# Patient Record
Sex: Male | Born: 1990 | Race: White | Hispanic: No | Smoking: Former smoker
Health system: Southern US, Community
[De-identification: ages and names within clinical notes are randomized; demographics above are authoritative.]

## PROBLEM LIST (undated history)

## (undated) DIAGNOSIS — Z8701 Personal history of pneumonia (recurrent): Secondary | ICD-10-CM

## (undated) DIAGNOSIS — G629 Polyneuropathy, unspecified: Secondary | ICD-10-CM

## (undated) DIAGNOSIS — F840 Autistic disorder: Secondary | ICD-10-CM

## (undated) DIAGNOSIS — I10 Essential (primary) hypertension: Secondary | ICD-10-CM

## (undated) DIAGNOSIS — F319 Bipolar disorder, unspecified: Secondary | ICD-10-CM

## (undated) DIAGNOSIS — F419 Anxiety disorder, unspecified: Secondary | ICD-10-CM

## (undated) DIAGNOSIS — Z779 Other contact with and (suspected) exposures hazardous to health: Secondary | ICD-10-CM

## (undated) DIAGNOSIS — G4730 Sleep apnea, unspecified: Secondary | ICD-10-CM

## (undated) DIAGNOSIS — U071 COVID-19: Secondary | ICD-10-CM

## (undated) DIAGNOSIS — Z8659 Personal history of other mental and behavioral disorders: Secondary | ICD-10-CM

## (undated) DIAGNOSIS — J45909 Unspecified asthma, uncomplicated: Secondary | ICD-10-CM

## (undated) HISTORY — DX: Autistic disorder: F84.0

## (undated) HISTORY — DX: Polyneuropathy, unspecified: G62.9

## (undated) HISTORY — DX: COVID-19: U07.1

## (undated) HISTORY — DX: Personal history of pneumonia (recurrent): Z87.01

## (undated) HISTORY — DX: Personal history of other mental and behavioral disorders: Z86.59

## (undated) HISTORY — PX: OTHER SURGICAL HISTORY: SHX169

## (undated) HISTORY — DX: Sleep apnea, unspecified: G47.30

## (undated) HISTORY — DX: Bipolar disorder, unspecified: F31.9

## (undated) HISTORY — DX: Anxiety disorder, unspecified: F41.9

## (undated) HISTORY — PX: TONSILLECTOMY: SUR1361

## (undated) HISTORY — PX: APPENDECTOMY: SHX54

## (undated) HISTORY — DX: Other contact with and (suspected) exposures hazardous to health: Z77.9

## (undated) HISTORY — DX: Unspecified asthma, uncomplicated: J45.909

---

## 2013-03-28 DIAGNOSIS — K219 Gastro-esophageal reflux disease without esophagitis: Secondary | ICD-10-CM | POA: Insufficient documentation

## 2019-11-30 DIAGNOSIS — E66811 Obesity, class 1: Secondary | ICD-10-CM | POA: Insufficient documentation

## 2019-11-30 DIAGNOSIS — E669 Obesity, unspecified: Secondary | ICD-10-CM | POA: Insufficient documentation

## 2021-02-23 DIAGNOSIS — U071 COVID-19: Secondary | ICD-10-CM | POA: Insufficient documentation

## 2021-03-20 ENCOUNTER — Other Ambulatory Visit: Payer: Self-pay

## 2021-03-20 ENCOUNTER — Ambulatory Visit: Payer: BC Managed Care – PPO | Admitting: Cardiology

## 2021-03-20 ENCOUNTER — Ambulatory Visit (INDEPENDENT_AMBULATORY_CARE_PROVIDER_SITE_OTHER): Payer: BC Managed Care – PPO

## 2021-03-20 ENCOUNTER — Encounter: Payer: Self-pay | Admitting: Cardiology

## 2021-03-20 VITALS — BP 130/80 | HR 105 | Ht 73.0 in | Wt 234.0 lb

## 2021-03-20 DIAGNOSIS — R001 Bradycardia, unspecified: Secondary | ICD-10-CM | POA: Diagnosis not present

## 2021-03-20 DIAGNOSIS — R0602 Shortness of breath: Secondary | ICD-10-CM | POA: Diagnosis not present

## 2021-03-20 NOTE — Progress Notes (Signed)
Cardiology Office Note:    Date:  03/20/2021   ID:  Seth Calderon, DOB June 07, 1991, MRN [id number]  PCP:  Orpah Greek, DO   [facility] Providers Cardiologist:  Kate Sable, MD     Referring MD: No ref. provider found   Chief Complaint  Patient presents with   New Patient (Initial Visit)    Referred by Urgent care for chest pain fatigue numbness tingling in extremeties (left arm/hand) dizziness decreased sats and hr. Patient states that when he gets up in the middle of the night his HR has been in the 30s. Meds reviewed verbally with patient.       History of Present Illness:    Seth Calderon is a [name] who presents due to low heart rate and shortness of breath.  Patient diagnosed with COVID-19 infection 3 weeks ago.  He had symptoms of muscle aches, shortness of breath, presented to the urgent care where COVID testing was positive.  He was started on medications which he took as prescribed for a week.  Since then, he has noticed slow heart rates especially when he is sleeping as low as 30s to 40s.  His conditioning has also decreased with him being more short of breath with his typical exercises.  He is not also able to obtain elevated heart rates as prior.  All symptoms began after COVID infection.  Minimal exertion causes him to be out of breath.  Has nonspecific chest discomfort and oral numbness.  Denies any history of heart disease.  History reviewed. No pertinent past medical history.  History reviewed. No pertinent surgical history.  Current Medications: Current Meds  Medication Sig   amphetamine-dextroamphetamine (ADDERALL XR) 20 MG 24 hr capsule Take 20 mg by mouth daily.   amphetamine-dextroamphetamine (ADDERALL) 20 MG tablet Take 20 mg by mouth daily.   buPROPion (WELLBUTRIN XL) 300 MG 24 hr tablet TAKE 1 TABLET BY MOUTH EVERY DAY IN THE MORNING   lamoTRIgine (LAMICTAL) 25 MG tablet Take 25 mg by mouth daily.      Allergies:   Cefaclor   Social History   Socioeconomic History   Marital status: Married    Spouse name: Not on file   Number of children: Not on file   Years of education: Not on file   Highest education level: Not on file  Occupational History   Not on file  Tobacco Use   Smoking status: Never    Passive exposure: Never   Smokeless tobacco: Never  Vaping Use   Vaping Use: Never used  Substance and Sexual Activity   Alcohol use: Never   Drug use: Never   Sexual activity: Not on file  Other Topics Concern   Not on file  Social History Narrative   Not on file   Social Determinants of Health   Financial Resource Strain: Not on file  Food Insecurity: Not on file  Transportation Needs: Not on file  Physical Activity: Not on file  Stress: Not on file  Social Connections: Not on file     Family History: The patient's family history is not on file.  ROS:   Please see the history of present illness.     All other systems reviewed and are negative.  EKGs/Labs/Other Studies Reviewed:    The following studies were reviewed today:   EKG:  EKG is  ordered today.  The ekg ordered today demonstrates sinus tachycardia, otherwise normal ECG, heart rate 105  Recent  Labs: No results found for requested labs within last 8760 hours.  Recent Lipid Panel No results found for: CHOL, TRIG, HDL, CHOLHDL, VLDL, LDLCALC, LDLDIRECT   Risk Assessment/Calculations:          Physical Exam:    VS:  BP 130/80 (BP Location: Left Arm, Patient Position: Sitting, Cuff Size: Normal)   Pulse (!) 105   Ht 6\' 1"  (1.854 m)   Wt 234 lb (106.1 kg)   SpO2 97%   BMI 30.87 kg/m     Wt Readings from Last 3 Encounters:  03/20/21 234 lb (106.1 kg)     GEN:  Well nourished, well developed in no acute distress HEENT: Normal NECK: No JVD; No carotid bruits LYMPHATICS: No lymphadenopathy CARDIAC: RRR, no murmurs, rubs, gallops RESPIRATORY:  Clear to auscultation without rales, wheezing  or rhonchi  ABDOMEN: Soft, non-tender, non-distended MUSCULOSKELETAL:  No edema; No deformity  SKIN: Warm and dry NEUROLOGIC:  Alert and oriented x 3 PSYCHIATRIC:  Normal affect   ASSESSMENT:    1. Bradycardia   2. Shortness of breath    PLAN:    In order of problems listed above:  Patient with abnormal heart rate since contracting COVID-19 infection.  Heart rates as low as 30s.  Place cardiac monitor to evaluate any conduction defects or heart blocks.  With low heart rates during sleep, possible OSA in differential.  Plan to refer patient to pulmonary medicine for OSA eval. Exertional shortness of breath since COVID-19 infection.  Obtain echocardiogram to evaluate systolic and diastolic function.  Symptoms could be lingering effects of COVID-19.  Refer patient to pulmonary medicine for additional input.  Symptoms not consistent with angina as such, stress testing not indicated.  Follow-up after echo and cardiac monitor.     Medication Adjustments/Labs and Tests Ordered: Current medicines are reviewed at length with the patient today.  Concerns regarding medicines are outlined above.  Orders Placed This Encounter  Procedures   LONG TERM MONITOR (3-14 DAYS)   EKG 12-Lead   ECHOCARDIOGRAM COMPLETE    No orders of the defined types were placed in this encounter.   Patient Instructions  Medication Instructions:  Your physician recommends that you continue on your current medications as directed. Please refer to the Current Medication list given to you today.  *If you need a refill on your cardiac medications before your next appointment, please call your pharmacy*   Lab Work: None ordered If you have labs (blood work) drawn today and your tests are completely normal, you will receive your results only by: [facility] (if you have MyChart) OR A paper copy in the mail If you have any lab test that is abnormal or we need to change your treatment, we will call you to review  the results.   Testing/Procedures:   Your physician has requested that you have an echocardiogram. Echocardiography is a painless test that uses sound waves to create images of your heart. It provides your doctor with information about the size and shape of your heart and how well your heart's chambers and valves are working. This procedure takes approximately one hour. There are no restrictions for this procedure.  Your physician has recommended that you wear a Zio XT monitor for 2 weeks.   This monitor is a medical device that records the heart's electrical activity. Doctors most often use these monitors to diagnose arrhythmias. Arrhythmias are problems with the speed or rhythm of the heartbeat. The monitor is a small device applied to  your chest. You can wear one while you do your normal daily activities. While wearing this monitor if you have any symptoms to push the button and record what you felt. Once you have worn this monitor for the period of time provider prescribed (Usually 14 days), you will return the monitor device in the postage paid box. Once it is returned they will download the data collected and provide [place] with a report which the provider will then review and we will call you with those results. Important tips:  Avoid showering during the first 24 hours of wearing the monitor. Avoid excessive sweating to help maximize wear time. Do not submerge the device, no hot tubs, and no swimming pools. Keep any lotions or oils away from the patch. After 24 hours you may shower with the patch on. Take brief showers with your back facing the shower head.  Do not remove patch once it has been placed because that will interrupt data and decrease adhesive wear time. Push the button when you have any symptoms and write down what you were feeling. Once you have completed wearing your monitor, remove and place into box which has postage paid and place in your outgoing mailbox.  If for some reason  you have misplaced your box then call our office and we can provide another box and/or mail it off for you.    Follow-Up: At [facility], you and your health needs are our priority.  As part of our continuing mission to provide you with exceptional heart care, we have created designated Provider Care Teams.  These Care Teams include your primary Cardiologist (physician) and Advanced Practice Providers (APPs -  Physician Assistants and Nurse Practitioners) who all work together to provide you with the care you need, when you need it.  We recommend signing up for the patient portal called "MyChart".  Sign up information is provided on this After Visit Summary.  MyChart is used to connect with patients for Virtual Visits (Telemedicine).  Patients are able to view lab/test results, encounter notes, upcoming appointments, etc.  Non-urgent messages can be sent to your provider as well.   To learn more about what you can do with MyChart, go to [email or web address].    Your next appointment:   Follow up 6 weeks   The format for your next appointment:   In Person  Provider:   Kate Sable, MD   Other Instructions    Signed, Kate Sable, MD  03/20/2021 11:26 AM    [place]

## 2021-03-20 NOTE — Patient Instructions (Signed)
Medication Instructions:  Your physician recommends that you continue on your current medications as directed. Please refer to the Current Medication list given to you today.  *If you need a refill on your cardiac medications before your next appointment, please call your pharmacy*   Lab Work: None ordered If you have labs (blood work) drawn today and your tests are completely normal, you will receive your results only by: [place] (if you have MyChart) OR A paper copy in the mail If you have any lab test that is abnormal or we need to change your treatment, we will call you to review the results.   Testing/Procedures:   Your physician has requested that you have an echocardiogram. Echocardiography is a painless test that uses sound waves to create images of your heart. It provides your doctor with information about the size and shape of your heart and how well your heart's chambers and valves are working. This procedure takes approximately one hour. There are no restrictions for this procedure.  Your physician has recommended that you wear a Zio XT monitor for 2 weeks.   This monitor is a medical device that records the heart's electrical activity. Doctors most often use these monitors to diagnose arrhythmias. Arrhythmias are problems with the speed or rhythm of the heartbeat. The monitor is a small device applied to your chest. You can wear one while you do your normal daily activities. While wearing this monitor if you have any symptoms to push the button and record what you felt. Once you have worn this monitor for the period of time provider prescribed (Usually 14 days), you will return the monitor device in the postage paid box. Once it is returned they will download the data collected and provide [place] with a report which the provider will then review and we will call you with those results. Important tips:  Avoid showering during the first 24 hours of wearing the monitor. Avoid  excessive sweating to help maximize wear time. Do not submerge the device, no hot tubs, and no swimming pools. Keep any lotions or oils away from the patch. After 24 hours you may shower with the patch on. Take brief showers with your back facing the shower head.  Do not remove patch once it has been placed because that will interrupt data and decrease adhesive wear time. Push the button when you have any symptoms and write down what you were feeling. Once you have completed wearing your monitor, remove and place into box which has postage paid and place in your outgoing mailbox.  If for some reason you have misplaced your box then call our office and we can provide another box and/or mail it off for you.    Follow-Up: At [facility], you and your health needs are our priority.  As part of our continuing mission to provide you with exceptional heart care, we have created designated Provider Care Teams.  These Care Teams include your primary Cardiologist (physician) and Advanced Practice Providers (APPs -  Physician Assistants and Nurse Practitioners) who all work together to provide you with the care you need, when you need it.  We recommend signing up for the patient portal called "MyChart".  Sign up information is provided on this After Visit Summary.  MyChart is used to connect with patients for Virtual Visits (Telemedicine).  Patients are able to view lab/test results, encounter notes, upcoming appointments, etc.  Non-urgent messages can be sent to your provider as well.   To  learn more about what you can do with MyChart, go to [email or web address].    Your next appointment:   Follow up 6 weeks   The format for your next appointment:   In Person  Provider:   Kate Sable, MD   Other Instructions

## 2021-04-08 ENCOUNTER — Telehealth: Payer: Self-pay | Admitting: Cardiology

## 2021-04-08 DIAGNOSIS — R0602 Shortness of breath: Secondary | ICD-10-CM

## 2021-04-08 NOTE — Telephone Encounter (Signed)
Patient would like to know his monitor results. Also, he would like to know the status of his pulmonary referral. Please call to discuss

## 2021-04-08 NOTE — Telephone Encounter (Signed)
Returned call to patient and he informed me that they did schedule his pulmonary appointment.  I also looked up in [place] that they did receive his zio monitor, however it has not been uploaded to our system to read yet. Patient is aware it will be a couple more days before we have a result note from him. I did give him a preliminary report from [facility] that he was predominately in sinus rhythm with a couple runs of Supraventricular tachycardia.  Patient was grateful for the call back.

## 2021-04-15 ENCOUNTER — Ambulatory Visit (INDEPENDENT_AMBULATORY_CARE_PROVIDER_SITE_OTHER): Payer: BC Managed Care – PPO | Admitting: Internal Medicine

## 2021-04-15 ENCOUNTER — Encounter: Payer: Self-pay | Admitting: Internal Medicine

## 2021-04-15 ENCOUNTER — Telehealth: Payer: Self-pay | Admitting: Internal Medicine

## 2021-04-15 ENCOUNTER — Other Ambulatory Visit: Payer: Self-pay

## 2021-04-15 ENCOUNTER — Ambulatory Visit
Admission: RE | Admit: 2021-04-15 | Discharge: 2021-04-15 | Disposition: A | Discharge status: Home / Self Care | Payer: BC Managed Care – PPO | Source: Ambulatory Visit | Attending: Internal Medicine | Admitting: Internal Medicine

## 2021-04-15 VITALS — BP 124/84 | HR 77 | Temp 98.0°F | Ht 73.0 in | Wt 238.8 lb

## 2021-04-15 DIAGNOSIS — R0602 Shortness of breath: Secondary | ICD-10-CM

## 2021-04-15 DIAGNOSIS — U071 COVID-19: Secondary | ICD-10-CM | POA: Diagnosis present

## 2021-04-15 DIAGNOSIS — G4719 Other hypersomnia: Secondary | ICD-10-CM | POA: Diagnosis not present

## 2021-04-15 IMAGING — CT CT ANGIO CHEST
2 of 7 series · 15 of 36 positions shown · IV contrast (omnipaque)
Comparison: None.

CLINICAL DATA: Shortness of breath

EXAM:
CT ANGIOGRAPHY CHEST WITH CONTRAST
TECHNIQUE: Multidetector CT imaging of the chest was performed using the
standard protocol during bolus administration of intravenous
contrast. Multiplanar CT image reconstructions and MIPs were
obtained to evaluate the vascular anatomy.
CONTRAST:  75mL OMNIPAQUE IOHEXOL 350 MG/ML SOLN

[Series 5: thins cta pulmonary 1.00 · axial · 0.74mm/px · z∈[-1364,-1088]mm · 14 of 400 slices shown]
[im 27/400  lung]
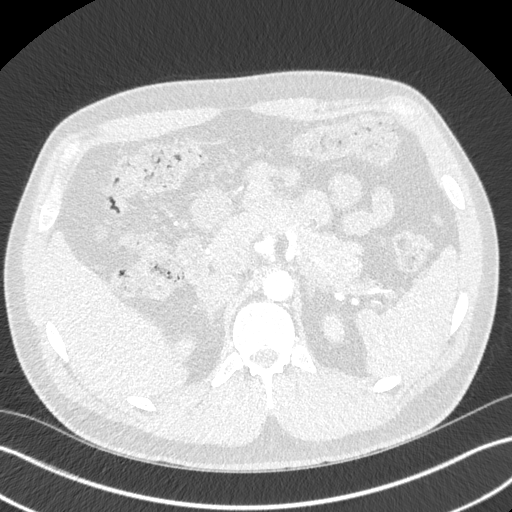
[im 54/400  mediastinal]
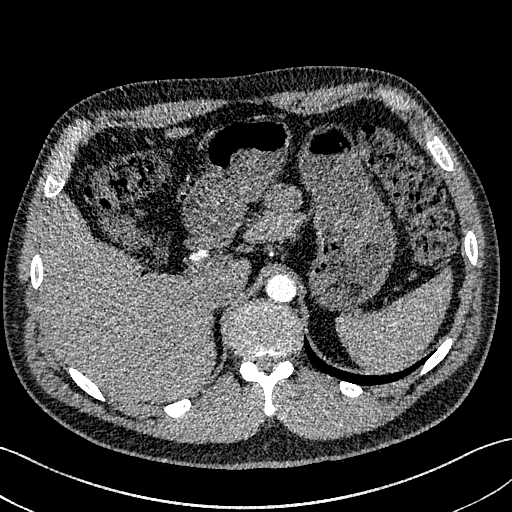
[im 80/400  lung]
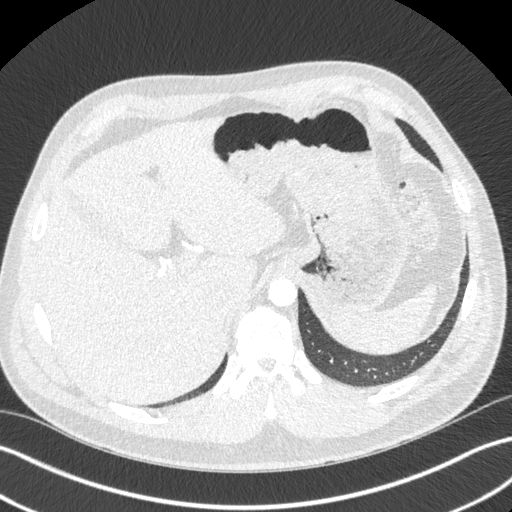
[im 107/400  mediastinal]
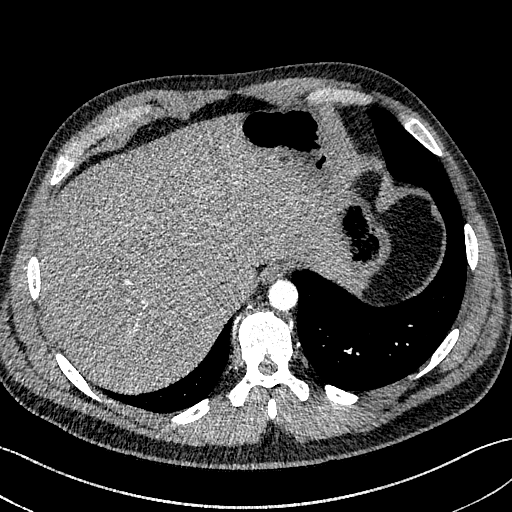
[im 134/400  lung]
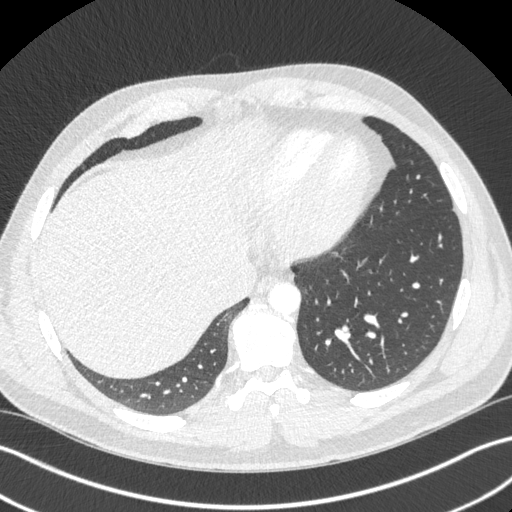
[im 160/400  mediastinal]
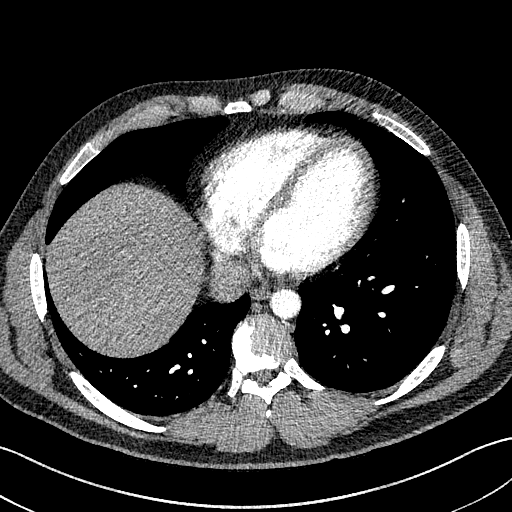
[im 187/400  lung]
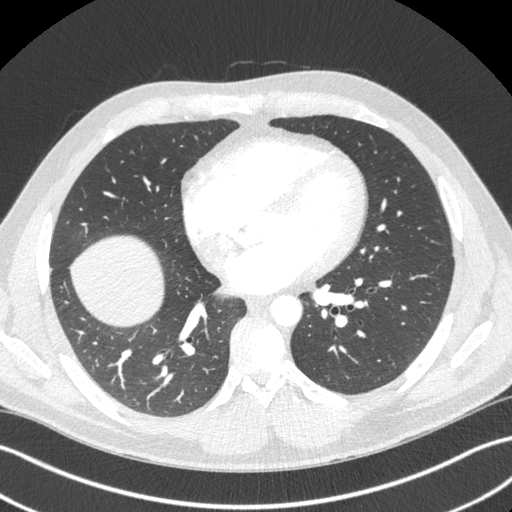
[im 213/400  mediastinal]
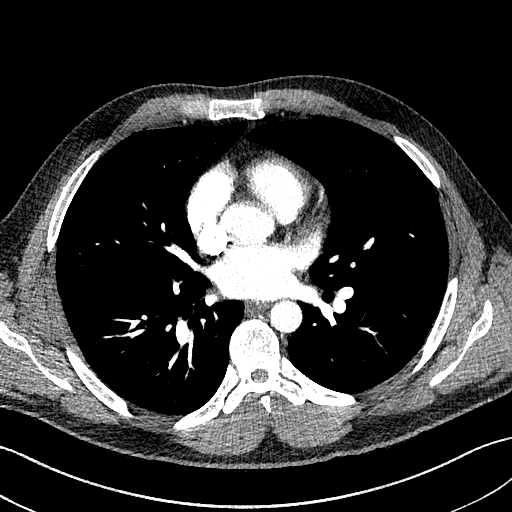
[im 240/400  lung]
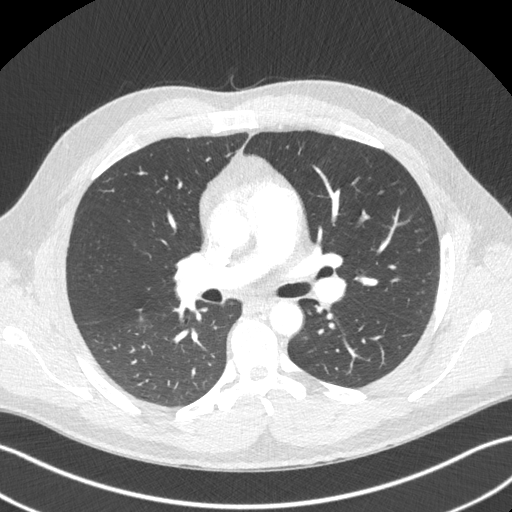
[im 267/400  mediastinal]
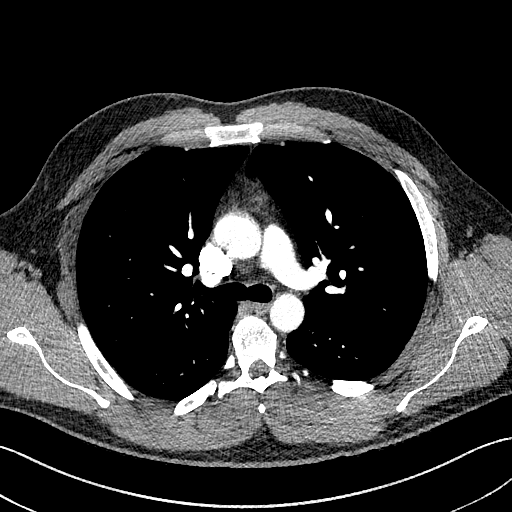
[im 293/400  lung]
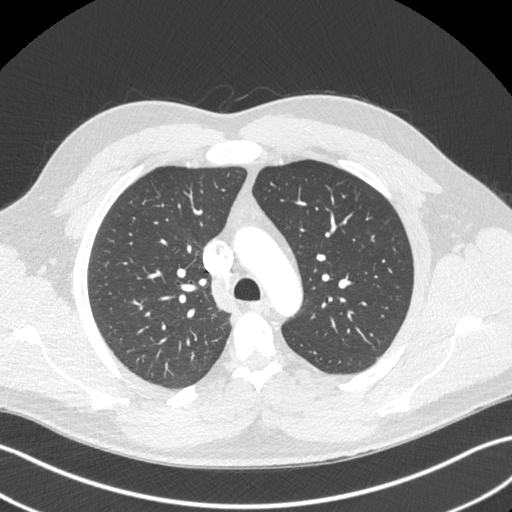
[im 320/400  mediastinal]
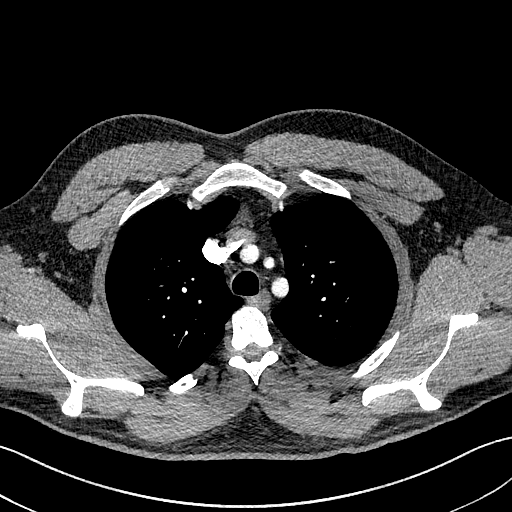
[im 346/400  lung]
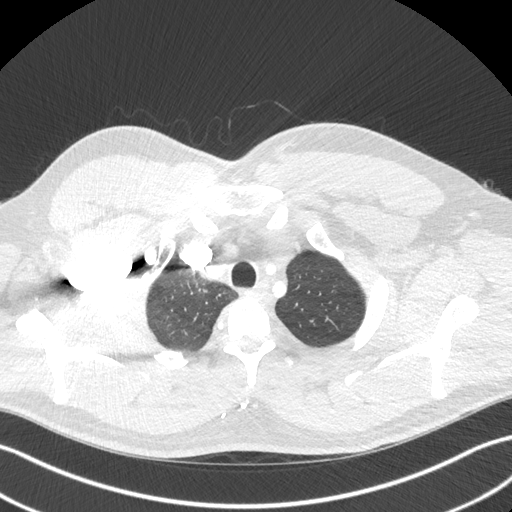
[im 373/400  mediastinal]
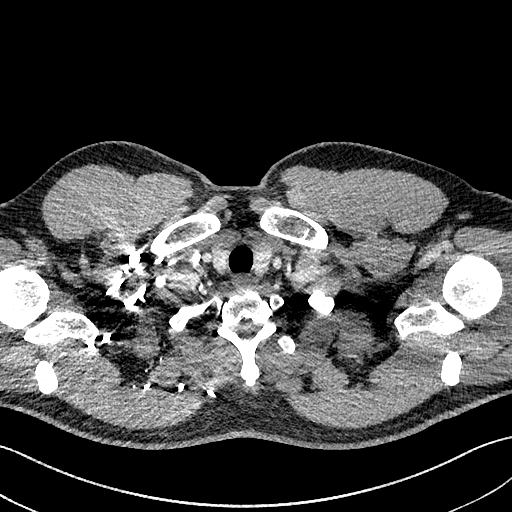

[Series 7: cta pulmonary 2.00 cor · coronal · 0.63mm/px · 1 of 151 slices shown]
[im 76/151  mediastinal]
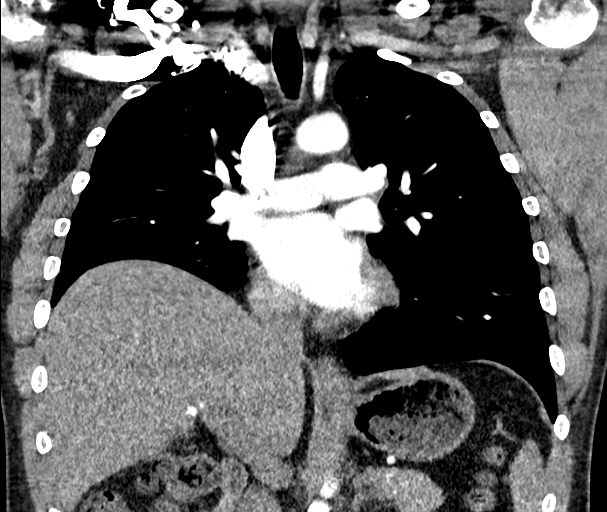

[15 of 36 positions shown; findings below may reference images not displayed]

FINDINGS: Cardiovascular: Satisfactory opacification of the pulmonary arteries
to the segmental level. No evidence of pulmonary embolism. Normal
heart size. No pericardial effusion.

Mediastinum/Nodes: No enlarged lymph nodes. Thyroid and esophagus
are unremarkable.

Lungs/Pleura: No consolidation or mass. No pleural effusion or
pneumothorax.

Upper Abdomen: No acute abnormality.

Musculoskeletal: Multilevel endplate Schmorl's nodes.

Review of the MIP images confirms the above findings.
IMPRESSION: No evidence of acute pulmonary embolism or other acute abnormality.

## 2021-04-15 MED ORDER — IOHEXOL 350 MG/ML SOLN
75.0000 mL | Freq: Once | INTRAVENOUS | Status: AC | PRN
  Administered 2021-04-15: 75 mL via INTRAVENOUS

## 2021-04-15 NOTE — Addendum Note (Signed)
Addended by: Carlisle Cater on: 04/15/2021 11:41 AM   Modules accepted: Orders

## 2021-04-15 NOTE — Progress Notes (Signed)
Name: Seth Calderon MRN: [id number] DOB: 30-Mar-1991     CONSULTATION DATE: 04/15/2021 REFERRING MD : Mylo Red  CHIEF COMPLAINT: SOB and excessive daytime sleepiness   HISTORY OF PRESENT ILLNESS:  [name] who presented to cardiology  due to low heart rate and shortness of breath.  Was seen by cardiology and sent to [facility] for further work up  Patient diagnosed with COVID-19 infection May 20th, 2022    +symptoms of muscle aches, shortness of breath, presented to the urgent care where COVID testing was positive.    He was started on medications which he took as prescribed for a week.   Since then, he has noticed slow heart rates especially when he is sleeping as low as 30s to 40s.    His conditioning has also decreased with him being more short of breath with his typical exercises.    All symptoms began after COVID infection.  Minimal exertion causes him to be out of breath.  Has nonspecific chest discomfort and oral numbness.  Denies any history of heart disease.  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  Encouraged proper weight management.  Discussed driving precautions and its relationship with hypersomnolence.  Discussed operating dangerous equipment and its relationship with hypersomnolence.  Discussed sleep hygiene, and benefits of a fixed sleep waked time.  The importance of getting eight or more hours of sleep discussed with patient.  Discussed limiting the use of the computer and television before bedtime.  Decrease naps during the day, so night time sleep will become enhanced.  Limit caffeine, and sleep deprivation.  HTN, stroke, and heart failure are potential risk factors.    EPWORTH SLEEP SCORE 0    PAST MEDICAL HISTORY :  COVID 19 infection  Prior to Admission medications   Medication Sig Start Date End Date Taking? Authorizing Provider  amphetamine-dextroamphetamine (ADDERALL XR) 20 MG 24 hr capsule Take 20 mg by mouth daily. 04/08/20   [provider]  amphetamine-dextroamphetamine (ADDERALL) 20 MG tablet Take 20 mg by mouth daily.    [provider]  buPROPion (WELLBUTRIN XL) 300 MG 24 hr tablet TAKE 1 TABLET BY MOUTH EVERY DAY IN THE MORNING 12/22/20   [provider]  lamoTRIgine (LAMICTAL) 25 MG tablet Take 25 mg by mouth daily. 03/19/21   [provider]   Allergies  Allergen Reactions   Cefaclor Other (See Comments)    Mom told him this when he was under 30yo.    FAMILY HISTORY:  +HTN  SOCIAL HISTORY:  reports that he has never smoked. He has never been exposed to tobacco smoke. He has never used smokeless tobacco. He reports that he does not drink alcohol and does not use drugs.    Review of Systems:  Gen:  + Fatigue HEENT: Denies blurred vision, double vision, ear pain, eye pain, hearing loss, nose bleeds, sore throat Cardiac:  No dizziness, chest pain or heaviness, chest tightness,edema, No JVD Resp:   No cough, -sputum  production, +shortness of breath,+wheezing, -hemoptysis,  Gi: Denies swallowing difficulty, stomach pain, nausea or vomiting, diarrhea, constipation, bowel incontinence Gu:  Denies bladder incontinence, burning urine Ext:   Denies Joint pain, stiffness or swelling Skin: Denies  skin rash, easy bruising or bleeding or hives Endoc:  Denies polyuria, polydipsia , polyphagia or weight change Psych:   Denies depression, insomnia or hallucinations  Other:  All other systems negative  ALL OTHER ROS ARE NEGATIVE  BP 124/84 (BP Location: Left Arm, Patient Position: Sitting, Cuff Size: Normal)   Pulse 77   Temp 98 F (36.7 C) (Oral)   Ht 6\' 1"  (1.854 m)   Wt 238 lb 12.8 oz (108.3 kg)   SpO2 100%   BMI 31.51 kg/m    Physical Examination:   General Appearance: No distress  EYES PERRLA, EOM intact.   NECK Supple, No JVD Pulmonary: normal breath sounds, No wheezing.  CardiovascularNormal S1,S2.  No m/r/g.   Abdomen: Benign, Soft, non-tender. Skin:   warm, no rashes, no ecchymosis  Extremities: normal, no cyanosis, clubbing. Neuro:without focal findings,  speech normal  PSYCHIATRIC: Mood, affect within normal limits.   ALL OTHER ROS ARE NEGATIVE       ASSESSMENT AND PLAN SYNOPSIS  30 year old pleasant white male seen today for follow-up assessment status post COVID-19 infection back in May 2022 with a previous history of diagnosis of excessive daytime sleepiness with sleep apnea, patient with progressive shortness of breath and dyspnea on exertion and shortness of breath with normal activity with decreased exercise tolerance in the setting of post COVID infection  At this time I believe patient will need extensive testing to assess for exertional hypoxia nocturnal hypoxia assess for blood clots and pulmonary function testing  Will need to assess for blood clots and pulmonary embolism by obtaining a CT of the chest rule out PE  Assess for exertional hypoxia with 6-minute walk  test Assess for nocturnal hypoxia with overnight pulse oximetry Obtain home sleep study as patient has a history of sleep apnea Recommend obtaining pulmonary function testing to assess lung function and reactive airways disease  Recommend continuing albuterol as needed for intermittent reactive airways disease   MEDICATION ADJUSTMENTS/LABS AND TESTS ORDERED: Obtain CT of the chest to rule out PE and assess lung function Obtain pulmonary function testing to assess lung function Check 6-minute walk test to assess for exertional hypoxia Check overnight pulse oximetry to check for nocturnal hypoxia Obtain home sleep test to assess for sleep apnea  Continue albuterol as needed   CURRENT MEDICATIONS REVIEWED AT LENGTH WITH PATIENT TODAY   Patient  satisfied with Plan of action and management. All questions answered  Follow up 3 months  Total Time Spent 65 mins   Corrin Parker, M.D.  [facility]  Medical Director [place] Director [facility] Cardio-Pulmonary Department

## 2021-04-15 NOTE — Patient Instructions (Addendum)
Obtain CT of the chest to rule out PE and assess lung function Obtain pulmonary function testing to assess lung function Check 6-minute walk test to assess for exertional hypoxia Check overnight pulse oximetry to check for nocturnal hypoxia Obtain home sleep test to assess for sleep apnea  Continue albuterol as needed

## 2021-04-15 NOTE — Telephone Encounter (Signed)
Spoke to patient, who is requesting CT results.  Dr. Mortimer Fries, please advise. Thanks

## 2021-04-15 NOTE — Telephone Encounter (Signed)
Patient is aware and voiced his understanding.  Nothing further needed.

## 2021-04-28 ENCOUNTER — Encounter: Payer: Self-pay | Admitting: Internal Medicine

## 2021-04-30 ENCOUNTER — Ambulatory Visit (INDEPENDENT_AMBULATORY_CARE_PROVIDER_SITE_OTHER): Payer: BC Managed Care – PPO

## 2021-04-30 ENCOUNTER — Telehealth: Payer: Self-pay

## 2021-04-30 ENCOUNTER — Other Ambulatory Visit: Payer: Self-pay

## 2021-04-30 DIAGNOSIS — R0609 Other forms of dyspnea: Secondary | ICD-10-CM

## 2021-04-30 DIAGNOSIS — R0600 Dyspnea, unspecified: Secondary | ICD-10-CM

## 2021-04-30 DIAGNOSIS — R0602 Shortness of breath: Secondary | ICD-10-CM

## 2021-04-30 DIAGNOSIS — G4719 Other hypersomnia: Secondary | ICD-10-CM

## 2021-04-30 DIAGNOSIS — U071 COVID-19: Secondary | ICD-10-CM

## 2021-04-30 NOTE — Telephone Encounter (Signed)
Pt would like to know the results of his ONO when they become available, thank you.

## 2021-05-06 ENCOUNTER — Ambulatory Visit (INDEPENDENT_AMBULATORY_CARE_PROVIDER_SITE_OTHER): Payer: BC Managed Care – PPO

## 2021-05-06 ENCOUNTER — Other Ambulatory Visit: Payer: Self-pay

## 2021-05-06 DIAGNOSIS — R001 Bradycardia, unspecified: Secondary | ICD-10-CM

## 2021-05-06 DIAGNOSIS — R0602 Shortness of breath: Secondary | ICD-10-CM | POA: Diagnosis not present

## 2021-05-06 LAB — ECHOCARDIOGRAM COMPLETE
AR max vel: 3.58 cm2
AV Area VTI: 3.64 cm2
AV Area mean vel: 3.23 cm2
AV Mean grad: 3 mmHg
AV Peak grad: 6.1 mmHg
Ao pk vel: 1.23 m/s
Area-P 1/2: 4.57 cm2
Calc EF: 53.4 %
S' Lateral: 3.4 cm
Single Plane A2C EF: 53.3 %
Single Plane A4C EF: 53.7 %

## 2021-05-08 ENCOUNTER — Encounter: Payer: Self-pay | Admitting: Cardiology

## 2021-05-08 ENCOUNTER — Other Ambulatory Visit: Payer: Self-pay

## 2021-05-08 ENCOUNTER — Ambulatory Visit: Payer: BC Managed Care – PPO | Admitting: Cardiology

## 2021-05-08 VITALS — BP 130/92 | HR 96 | Ht 73.0 in | Wt 238.0 lb

## 2021-05-08 DIAGNOSIS — R001 Bradycardia, unspecified: Secondary | ICD-10-CM

## 2021-05-08 DIAGNOSIS — R0602 Shortness of breath: Secondary | ICD-10-CM | POA: Diagnosis not present

## 2021-05-08 NOTE — Progress Notes (Signed)
Cardiology Office Note:    Date:  05/08/2021   ID:  Seth Calderon, DOB December 28, 1990, MRN [id number]  PCP:  [facility] [place] [facility] Cardiologist:  Kate Sable, MD     Referring MD: Cherly Beach*   Chief Complaint  Patient presents with   Other    6 week follow up. Patient c.o SOB and Chest pain with exertion. Meds reviewed verbally with patient.       History of Present Illness:    Seth Calderon is a [name] who presents for follow-up.  Previously seen due to low heart rate and shortness of breath after being diagnosed with COVID-19 3 weeks earlier.    Cardiac monitor was placed to evaluate any significant arrhythmias, echocardiogram also obtained.  Patient diagnosed with COVID-19 infection 3 weeks ago.  He had symptoms of muscle aches, shortness of breath, presented to the urgent care where COVID testing was positive.  He was started on medications which he took as prescribed for a week.  Since then, he has noticed slow heart rates especially when he is sleeping as low as 30s to 40s.  His conditioning has also decreased with him being more short of breath with his typical exercises.  He is not also able to obtain elevated heart rates as prior.  All symptoms began after COVID infection.  Minimal exertion causes him to be out of breath.  Has nonspecific chest discomfort and oral numbness.  Denies any history of heart disease.  History reviewed. No pertinent past medical history.  History reviewed. No pertinent surgical history.  Current Medications: Current Meds  Medication Sig   amphetamine-dextroamphetamine (ADDERALL XR) 20 MG 24 hr capsule Take 20 mg by mouth daily.   amphetamine-dextroamphetamine (ADDERALL) 20 MG tablet Take 20 mg by mouth daily.   buPROPion (WELLBUTRIN XL) 300 MG 24 hr tablet TAKE 1 TABLET BY MOUTH EVERY DAY IN THE MORNING   clonazePAM (KLONOPIN) 1 MG tablet Take 1 mg by  mouth daily as needed.   lamoTRIgine 25 & 50 & 100 MG KIT Take 50 mg by mouth daily.   loratadine (CLARITIN) 10 MG tablet Take 10 mg by mouth daily.   sertraline (ZOLOFT) 100 MG tablet Take 100 mg by mouth daily.     Allergies:   Cefaclor   Social History   Socioeconomic History   Marital status: Married    Spouse name: Not on file   Number of children: Not on file   Years of education: Not on file   Highest education level: Not on file  Occupational History   Not on file  Tobacco Use   Smoking status: Never    Passive exposure: Never   Smokeless tobacco: Never  Vaping Use   Vaping Use: Never used  Substance and Sexual Activity   Alcohol use: Never   Drug use: Never   Sexual activity: Not on file  Other Topics Concern   Not on file  Social History Narrative   Not on file   Social Determinants of Health   Financial Resource Strain: Not on file  Food Insecurity: Not on file  Transportation Needs: Not on file  Physical Activity: Not on file  Stress: Not on file  Social Connections: Not on file     Family History: The patient's family history is not on file.  ROS:   Please see the history of present illness.     All other systems reviewed  and are negative.  EKGs/Labs/Other Studies Reviewed:    The following studies were reviewed today:   EKG:  EKG is  ordered today.  The ekg ordered today demonstrates sinus tachycardia, otherwise normal ECG, heart rate 105  Recent Labs: No results found for requested labs within last 8760 hours.  Recent Lipid Panel No results found for: CHOL, TRIG, HDL, CHOLHDL, VLDL, LDLCALC, LDLDIRECT   Risk Assessment/Calculations:          Physical Exam:    VS:  BP (!) 130/92 (BP Location: Left Arm, Patient Position: Sitting, Cuff Size: Normal)   Pulse 96   Ht _0  (1.854 m)   Wt 238 lb (108 kg)   SpO2 98%   BMI 31.40 kg/m     Wt Readings from Last 3 Encounters:  05/08/21 238 lb (108 kg)  04/15/21 238 lb 12.8 oz (108.3  kg)  03/20/21 234 lb (106.1 kg)     GEN:  Well nourished, well developed in no acute distress HEENT: Normal NECK: No JVD; No carotid bruits LYMPHATICS: No lymphadenopathy CARDIAC: RRR, no murmurs, rubs, gallops RESPIRATORY:  Clear to auscultation without rales, wheezing or rhonchi  ABDOMEN: Soft, non-tender, non-distended MUSCULOSKELETAL:  No edema; No deformity  SKIN: Warm and dry NEUROLOGIC:  Alert and oriented x 3 PSYCHIATRIC:  Normal affect   ASSESSMENT:    1. Bradycardia   2. Shortness of breath     PLAN:    In order of problems listed above:  Patient with abnormal heart rate since contracting COVID-19 infection.  Heart rates as low as 30s.  Cardiac monitor with no significant arrhythmias, minimal heart rate 53 average heart rate 97.  Benign cardiac, continue to monitor.  Patient made aware of results and reassured. Exertional shortness of breath since COVID-19 infection.  Echo was normal, symptoms not associated with exertion, does not appear angina, patient is young low risk patient for CAD.  Follow-up as needed     Medication Adjustments/Labs and Tests Ordered: Current medicines are reviewed at length with the patient today.  Concerns regarding medicines are outlined above.  Orders Placed This Encounter  Procedures   EKG 12-Lead     No orders of the defined types were placed in this encounter.   Patient Instructions  Medication Instructions:  Your physician recommends that you continue on your current medications as directed. Please refer to the Current Medication list given to you today.  *If you need a refill on your cardiac medications before your next appointment, please call your pharmacy*   Lab Work: None ordered If you have labs (blood work) drawn today and your tests are completely normal, you will receive your results only by: [place] (if you have MyChart) OR A paper copy in the mail If you have any lab test that is abnormal or we need  to change your treatment, we will call you to review the results.   Testing/Procedures: None ordered   Follow-Up: At [facility], you and your health needs are our priority.  As part of our continuing mission to provide you with exceptional heart care, we have created designated Provider Care Teams.  These Care Teams include your primary Cardiologist (physician) and Advanced Practice Providers (APPs -  Physician Assistants and Nurse Practitioners) who all work together to provide you with the care you need, when you need it.  We recommend signing up for the patient portal called "MyChart".  Sign up information is provided on this After Visit Summary.  MyChart is  used to connect with patients for Virtual Visits (Telemedicine).  Patients are able to view lab/test results, encounter notes, upcoming appointments, etc.  Non-urgent messages can be sent to your provider as well.   To learn more about what you can do with MyChart, go to [email or web address].    Your next appointment:   Follow up as needed   The format for your next appointment:   In Person  Provider:   You may see Kate Sable, MD or one of the following Advanced Practice Providers on your designated Care Team:   Murray Hodgkins, NP Christell Faith, PA-C Marrianne Mood, PA-C Cadence Bonsall, [place]   Other Instructions    Signed, Kate Sable, MD  05/08/2021 12:21 PM    [place]

## 2021-05-08 NOTE — Patient Instructions (Signed)
Medication Instructions:  Your physician recommends that you continue on your current medications as directed. Please refer to the Current Medication list given to you today.  *If you need a refill on your cardiac medications before your next appointment, please call your pharmacy*   Lab Work: None ordered If you have labs (blood work) drawn today and your tests are completely normal, you will receive your results only by: [place] (if you have MyChart) OR A paper copy in the mail If you have any lab test that is abnormal or we need to change your treatment, we will call you to review the results.   Testing/Procedures: None ordered   Follow-Up: At [facility], you and your health needs are our priority.  As part of our continuing mission to provide you with exceptional heart care, we have created designated Provider Care Teams.  These Care Teams include your primary Cardiologist (physician) and Advanced Practice Providers (APPs -  Physician Assistants and Nurse Practitioners) who all work together to provide you with the care you need, when you need it.  We recommend signing up for the patient portal called "MyChart".  Sign up information is provided on this After Visit Summary.  MyChart is used to connect with patients for Virtual Visits (Telemedicine).  Patients are able to view lab/test results, encounter notes, upcoming appointments, etc.  Non-urgent messages can be sent to your provider as well.   To learn more about what you can do with MyChart, go to [email or web address].    Your next appointment:   Follow up as needed   The format for your next appointment:   In Person  Provider:   You may see Kate Sable, MD or one of the following Advanced Practice Providers on your designated Care Team:   Murray Hodgkins, NP Christell Faith, PA-C Marrianne Mood, PA-C Cadence Kathlen Mody, [place]   Other Instructions

## 2021-05-15 NOTE — Telephone Encounter (Signed)
Mychart message sent by pt checking to see if the results of his ONO have come back. Pt is a [place] pt. Routing to [place] triage.

## 2021-05-20 NOTE — Telephone Encounter (Signed)
Have you received ONO results on this patient?

## 2021-05-26 ENCOUNTER — Telehealth: Payer: Self-pay

## 2021-05-26 DIAGNOSIS — G4734 Idiopathic sleep related nonobstructive alveolar hypoventilation: Secondary | ICD-10-CM

## 2021-05-26 NOTE — Telephone Encounter (Signed)
Pt states he does not have preference but would like one that accepts his insurance.

## 2021-05-26 NOTE — Telephone Encounter (Signed)
Called and spoke with patient in regards to test results, Per Dr Mortimer Fries patient qualifies for 1L of O2 [place] at night, patient states he is going to talk to his wife who is a NP and will give [place] a call back later today.

## 2021-05-26 NOTE — Telephone Encounter (Signed)
Lm for patient to verify preferred DME company.

## 2021-05-27 NOTE — Telephone Encounter (Signed)
Order has been placed to [employer]. Nothing further needed at this time.

## 2021-06-03 NOTE — Telephone Encounter (Signed)
Rodena Piety, please advise.

## 2021-06-03 NOTE — Telephone Encounter (Signed)
I have sent an urgent message to [employer] to check into this issue

## 2021-06-03 NOTE — Telephone Encounter (Signed)
Per Danielle with [employer] they have been trying to get in touch with the patient see below Ronnald Ramp, Willodean Rosenthal, Roselee Nova, [place]; [place], Leory Plowman; Nash Shearer Yes ma'am, I was trying to get in contact with him so we could del the O2.

## 2021-06-16 ENCOUNTER — Telehealth: Payer: Self-pay | Admitting: Internal Medicine

## 2021-06-16 DIAGNOSIS — R0602 Shortness of breath: Secondary | ICD-10-CM

## 2021-06-16 NOTE — Telephone Encounter (Addendum)
Patient states that her O2 at 1 Liter is still giving him headaches and his electricity bill has gone up. He also states that the machine is loud. Patient would to try something different.  Dr. Mortimer Fries please advise  Thanks

## 2021-06-17 NOTE — Telephone Encounter (Signed)
Called and spoke with [facility] from [place]. She has put in a ticket to go and service the patient's equipment and to bring out more nasal cannulas.   I spoke with patient, he's states he doesn't think the equipment needs servicing. States he is waiting on a call to pick up equipment for his sleep study. States he is hoping to pick it up on Friday to do the study over the weekend. Patient also mentioned that you wanted him to have a Pulmonary Function Test. States he did not know what test you're wanting him to do, I explained that it was a PFT. He states that you didn't specify and that there are many ways to assess your pulmonary function and no one has reached out to schedule anything.   Dr. Mortimer Fries please advise  Thanks  Joyce [facility] it look like patient has orders in the system. He is wanting someone to call him to schedule.  Thank you

## 2021-06-17 NOTE — Telephone Encounter (Signed)
I put in order for PFT.  Nothing further needed at this time.

## 2021-06-18 ENCOUNTER — Other Ambulatory Visit: Payer: Self-pay

## 2021-06-18 ENCOUNTER — Ambulatory Visit: Payer: BC Managed Care – PPO

## 2021-06-18 DIAGNOSIS — G4719 Other hypersomnia: Secondary | ICD-10-CM

## 2021-06-18 DIAGNOSIS — G4733 Obstructive sleep apnea (adult) (pediatric): Secondary | ICD-10-CM | POA: Diagnosis not present

## 2021-06-19 DIAGNOSIS — G4733 Obstructive sleep apnea (adult) (pediatric): Secondary | ICD-10-CM | POA: Diagnosis not present

## 2021-06-22 ENCOUNTER — Telehealth: Payer: Self-pay | Admitting: Internal Medicine

## 2021-06-22 DIAGNOSIS — G4733 Obstructive sleep apnea (adult) (pediatric): Secondary | ICD-10-CM

## 2021-06-22 NOTE — Telephone Encounter (Signed)
Dr. Halford Chessman has read this pt's HST & it has been scanned into pt's chart.

## 2021-06-26 ENCOUNTER — Telehealth: Payer: Self-pay

## 2021-06-26 NOTE — Telephone Encounter (Signed)
Called and LVM for patient in regards to his upcoming covid test, will rout this to [place] triage to insure follow up.

## 2021-06-26 NOTE — Telephone Encounter (Signed)
Dr. Kasa, please advise. thanks 

## 2021-06-29 NOTE — Telephone Encounter (Signed)
Order placed.  Patient is aware and voiced his understanding.  Nothing further needed at this time.

## 2021-06-29 NOTE — Telephone Encounter (Addendum)
Patient is aware of results and voiced his understanding.  He is agreeable with treatment. Order has been placed. Results mailed to address on file per patient request.   Patient is questioning if he will need to continue oxygen with cpap.  Dr. Mortimer Fries, please advise. Would you like to order ONO on cpap?

## 2021-06-29 NOTE — Telephone Encounter (Signed)
Patient is aware of below message and voiced his understanding.  Nothing further needed at this time.   

## 2021-06-30 ENCOUNTER — Other Ambulatory Visit
Admission: RE | Admit: 2021-06-30 | Discharge: 2021-06-30 | Disposition: A | Discharge status: Home / Self Care | Payer: BC Managed Care – PPO | Source: Ambulatory Visit | Attending: Internal Medicine | Admitting: Internal Medicine

## 2021-06-30 ENCOUNTER — Other Ambulatory Visit: Payer: Self-pay

## 2021-06-30 DIAGNOSIS — Z01812 Encounter for preprocedural laboratory examination: Secondary | ICD-10-CM | POA: Insufficient documentation

## 2021-06-30 DIAGNOSIS — Z20822 Contact with and (suspected) exposure to covid-19: Secondary | ICD-10-CM | POA: Diagnosis not present

## 2021-07-01 ENCOUNTER — Ambulatory Visit: Payer: BC Managed Care – PPO | Attending: Internal Medicine

## 2021-07-01 DIAGNOSIS — R0602 Shortness of breath: Secondary | ICD-10-CM | POA: Diagnosis present

## 2021-07-01 DIAGNOSIS — U071 COVID-19: Secondary | ICD-10-CM | POA: Diagnosis not present

## 2021-07-01 LAB — PULMONARY FUNCTION TEST ARMC ONLY
DL/VA % pred: 75 %
DL/VA: 3.64 ml/min/mmHg/L
DLCO unc % pred: 93 %
DLCO unc: 33.12 ml/min/mmHg
FEF 25-75 Post: 6.57 L/sec
FEF 25-75 Pre: 6.22 L/sec
FEF2575-%Change-Post: 5 %
FEF2575-%Pred-Post: 139 %
FEF2575-%Pred-Pre: 132 %
FEV1-%Change-Post: 1 %
FEV1-%Pred-Post: 120 %
FEV1-%Pred-Pre: 119 %
FEV1-Post: 5.85 L
FEV1-Pre: 5.75 L
FEV1FVC-%Change-Post: 2 %
FEV1FVC-%Pred-Pre: 101 %
FEV6-%Change-Post: 0 %
FEV6-%Pred-Post: 115 %
FEV6-%Pred-Pre: 116 %
FEV6-Post: 6.82 L
FEV6-Pre: 6.88 L
FEV6FVC-%Change-Post: 0 %
FEV6FVC-%Pred-Post: 101 %
FEV6FVC-%Pred-Pre: 100 %
FVC-%Change-Post: 0 %
FVC-%Pred-Post: 114 %
FVC-%Pred-Pre: 115 %
FVC-Post: 6.85 L
FVC-Pre: 6.92 L
Post FEV1/FVC ratio: 85 %
Post FEV6/FVC ratio: 100 %
Pre FEV1/FVC ratio: 83 %
Pre FEV6/FVC Ratio: 99 %
RV % pred: 68 %
RV: 1.23 L
TLC % pred: 113 %
TLC: 8.48 L

## 2021-07-01 LAB — SARS CORONAVIRUS 2 (TAT 6-24 HRS): SARS Coronavirus 2: NEGATIVE

## 2021-07-01 MED ORDER — ALBUTEROL SULFATE (2.5 MG/3ML) 0.083% IN NEBU
2.5000 mg | INHALATION_SOLUTION | Freq: Once | RESPIRATORY_TRACT | Status: AC
  Administered 2021-07-01: 2.5 mg via RESPIRATORY_TRACT
  Filled 2021-07-01: qty 3

## 2021-07-21 NOTE — Telephone Encounter (Signed)
Spoke with Mel Almond regarding this email. She has Kerrin Champagne for the CPT codes that go with the Dx codes. Will forward email to her per her request for f/u.

## 2021-07-23 NOTE — Telephone Encounter (Signed)
Seth Calderon, please advise. Thank  Patient would like for [place] to forward this request to the billing department as well.

## 2021-07-23 NOTE — Telephone Encounter (Signed)
Hey Candice,   Can you please look into this for me?

## 2021-07-30 NOTE — Telephone Encounter (Signed)
Dr. Kasa please advise. Thanks 

## 2021-08-18 ENCOUNTER — Ambulatory Visit: Payer: BC Managed Care – PPO | Admitting: Internal Medicine

## 2021-08-18 ENCOUNTER — Other Ambulatory Visit: Payer: Self-pay

## 2021-08-18 ENCOUNTER — Encounter: Payer: Self-pay | Admitting: Internal Medicine

## 2021-08-18 VITALS — BP 146/88 | HR 100 | Temp 97.5°F | Ht 73.0 in | Wt 247.2 lb

## 2021-08-18 DIAGNOSIS — G4733 Obstructive sleep apnea (adult) (pediatric): Secondary | ICD-10-CM | POA: Diagnosis not present

## 2021-08-18 DIAGNOSIS — J3089 Other allergic rhinitis: Secondary | ICD-10-CM

## 2021-08-18 DIAGNOSIS — J4540 Moderate persistent asthma, uncomplicated: Secondary | ICD-10-CM | POA: Diagnosis not present

## 2021-08-18 MED ORDER — ADVAIR HFA 230-21 MCG/ACT IN AERO: 2.0000 | INHALATION_SPRAY | Freq: Two times a day (BID) | RESPIRATORY_TRACT | 12 refills | Status: DC

## 2021-08-18 NOTE — Patient Instructions (Addendum)
Start Advair HFA [place] as prescribed Continue Oxygen as prescribed Recommend weight loss  ENT referral for uncontrolled allergic rhinits

## 2021-08-18 NOTE — Progress Notes (Signed)
Name: Seth Calderon MRN: [id number] DOB: 1990/11/03     CONSULTATION DATE: 08/18/2021 REFERRING MD : Joylene Igo Establish care 7 months ago for sleep apnea and moderate persistent asthma Status post COVID-19 infection May 2022 All symptoms began after COVID infection.  Minimal exertion causes him to be out of breath.  Has nonspecific chest discomfort and oral numbness.  Denies any history of heart disease.  Patient has been using albuterol as needed which has helped Patient has decreased exercise tolerance Shortness of breath with exertion Uncertain at best with normal activity p  patient have been diagnosed with sleep apnea in the past Patient has relative hypoxia at night which was checked at home by his pulse oximetry when he weighed approximately 280 pounds Current weight is 238 pounds He is a non-smoker p no secondhand smoke exposure Drinks 1 drink a day of alcohol He works as a [employer]  From home    CC Follow-up OSA  Follow-up moderate persistent asthma    HISTORY OF PRESENT ILLNESS: COVID-19 infection May 2022 Has been having moderate persistent asthma symptoms with cough congestion wheezing Has tried Spiriva HandiHaler which helps a little bit Increased use in albuterol We will try Advair HFA   Patient diagnosed with sleep apnea September 2022 AHI of 16 Moderate OSA Started on full facemask auto CPAP 8-18  Patient states that he has worsening symptoms with headaches ONO  supports a diagnosis nocturnal hypoxia along with asthma symptoms   CT of the chest and pulmonary function test reviewed in detail with the patient   No exacerbation at this time No evidence of heart failure at this time No evidence or signs of infection at this time No respiratory distress No fevers, chills, nausea, vomiting, diarrhea No evidence of lower extremity edema No evidence hemoptysis  Discussed sleep data and reviewed  with patient.  Encouraged proper weight management.  Discussed driving precautions and its relationship with hypersomnolence.  Discussed operating dangerous equipment and its relationship with hypersomnolence.  Discussed sleep hygiene, and benefits of a fixed sleep waked time.  The importance of getting eight or more hours of sleep discussed with patient.  Discussed limiting the use of the computer and television before bedtime.  Decrease naps during the day, so night time sleep will become enhanced.  Limit caffeine, and sleep deprivation.  HTN, stroke, and heart failure are potential risk factors.     PAST MEDICAL HISTORY :  COVID 19 infection  Prior to Admission medications   Medication Sig Start Date End Date Taking? Authorizing Provider  amphetamine-dextroamphetamine (ADDERALL XR) 20 MG 24 hr capsule Take 20 mg by mouth daily. 04/08/20   [provider]  amphetamine-dextroamphetamine (ADDERALL) 20 MG tablet Take 20 mg by mouth daily.    [provider]  buPROPion (WELLBUTRIN XL) 300 MG 24 hr tablet TAKE 1 TABLET BY MOUTH EVERY DAY IN THE MORNING 12/22/20   [provider]  lamoTRIgine (LAMICTAL) 25 MG tablet Take 25 mg by mouth daily. 03/19/21   [provider]   Allergies  Allergen Reactions   Cefaclor Other (See Comments)    Mom told him this when he was under 49yo.    Review of Systems:  Gen:  Denies  fever, sweats, chills weight loss  HEENT: Denies blurred vision, double vision, ear pain, eye pain, hearing loss, nose bleeds, sore throat Cardiac:  No dizziness, chest pain or heaviness, chest tightness,edema, No JVD Resp:   No cough, -sputum production, -  shortness of breath,-wheezing, -hemoptysis,  Other:  All other systems negative   BP (!) 146/88 (BP Location: Left Arm, Patient Position: Sitting, Cuff Size: Normal)   Pulse 100   Temp (!) 97.5 F (36.4 C) (Oral)   Ht 6\' 1"  (1.854 m)   Wt 247 lb 3.2 oz (112.1 kg)   SpO2 99%   BMI 32.61  kg/m    Physical Examination:   General Appearance: No distress  EYES PERRLA, EOM intact.   NECK Supple, No JVD Pulmonary: normal breath sounds, No wheezing.  CardiovascularNormal S1,S2.  No m/r/g.    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 moderate persistent asthma Recommend starting Advair HFA 232 puffs twice a day Continue Spiriva Respimat HandiHaler as prescribed Albuterol as needed No indication for antibiotics or prednisone at this time CT of the chest reviewed in detail with patient Pulmonary function test also reviewed with patient  Chronic hypoxic respiratory failure due to moderate persistent asthma Underlying OSA Continue oxygen as prescribed  Recommend weight loss Patient with a history of moderate sleep apnea Compliance report reviewed in detail with patient Patient no longer wants CPAP therapy due to worsening symptoms   MEDICATION ADJUSTMENTS/LABS AND TESTS ORDERED: Start Advair HFA [place] as prescribed Continue Oxygen as prescribed Recommend weight loss  ENT referral for uncontrolled allergic rhinits Continue albuterol as needed      Patient  satisfied with Plan of action and management. All questions answered  Follow-up 6 months  Total time spent 32 minutes   Corrin Parker, M.D.  [facility]  Medical Director [place] Director [facility] Cardio-Pulmonary Department

## 2021-09-07 ENCOUNTER — Other Ambulatory Visit: Payer: Self-pay

## 2021-09-07 ENCOUNTER — Ambulatory Visit: Payer: BC Managed Care – PPO | Admitting: Dermatology

## 2021-09-07 DIAGNOSIS — D225 Melanocytic nevi of trunk: Secondary | ICD-10-CM

## 2021-09-07 DIAGNOSIS — S90862A Insect bite (nonvenomous), left foot, initial encounter: Secondary | ICD-10-CM

## 2021-09-07 DIAGNOSIS — D229 Melanocytic nevi, unspecified: Secondary | ICD-10-CM

## 2021-09-07 DIAGNOSIS — T148XXA Other injury of unspecified body region, initial encounter: Secondary | ICD-10-CM

## 2021-09-07 DIAGNOSIS — B360 Pityriasis versicolor: Secondary | ICD-10-CM

## 2021-09-07 DIAGNOSIS — W57XXXA Bitten or stung by nonvenomous insect and other nonvenomous arthropods, initial encounter: Secondary | ICD-10-CM

## 2021-09-07 DIAGNOSIS — Z1283 Encounter for screening for malignant neoplasm of skin: Secondary | ICD-10-CM

## 2021-09-07 DIAGNOSIS — S40212A Abrasion of left shoulder, initial encounter: Secondary | ICD-10-CM

## 2021-09-07 DIAGNOSIS — S40211A Abrasion of right shoulder, initial encounter: Secondary | ICD-10-CM

## 2021-09-07 DIAGNOSIS — S90861A Insect bite (nonvenomous), right foot, initial encounter: Secondary | ICD-10-CM

## 2021-09-07 DIAGNOSIS — L74510 Primary focal hyperhidrosis, axilla: Secondary | ICD-10-CM

## 2021-09-07 DIAGNOSIS — L814 Other melanin hyperpigmentation: Secondary | ICD-10-CM

## 2021-09-07 DIAGNOSIS — L74519 Primary focal hyperhidrosis, unspecified: Secondary | ICD-10-CM

## 2021-09-07 DIAGNOSIS — L739 Follicular disorder, unspecified: Secondary | ICD-10-CM

## 2021-09-07 DIAGNOSIS — L578 Other skin changes due to chronic exposure to nonionizing radiation: Secondary | ICD-10-CM

## 2021-09-07 MED ORDER — CLINDAMYCIN PHOSPHATE 1 % EX SOLN: CUTANEOUS | 2 refills | Status: DC

## 2021-09-07 MED ORDER — MUPIROCIN 2 % EX OINT: TOPICAL_OINTMENT | CUTANEOUS | 1 refills | Status: DC

## 2021-09-07 MED ORDER — QBREXZA 2.4 % EX PADS: MEDICATED_PAD | CUTANEOUS | 5 refills | Status: DC

## 2021-09-07 MED ORDER — KETOCONAZOLE 2 % EX CREA: TOPICAL_CREAM | CUTANEOUS | 3 refills | Status: AC

## 2021-09-07 MED ORDER — KETOCONAZOLE 2 % EX SHAM: MEDICATED_SHAMPOO | CUTANEOUS | 3 refills | Status: AC

## 2021-09-07 MED ORDER — FLUOCINONIDE 0.05 % EX SOLN: CUTANEOUS | 2 refills | Status: AC

## 2021-09-07 NOTE — Patient Instructions (Addendum)
Melanoma ABCDEs  Melanoma is the most dangerous type of skin cancer, and is the leading cause of death from skin disease.  You are more likely to develop melanoma if you: Have light-colored skin, light-colored eyes, or red or blond hair Spend a lot of time in the sun Tan regularly, either outdoors or in a tanning bed Have had blistering sunburns, especially during childhood Have a close family member who has had a melanoma Have atypical moles or large birthmarks  Early detection of melanoma is key since treatment is typically straightforward and cure rates are extremely high if we catch it early.   The first sign of melanoma is often a change in a mole or a new dark spot.  The ABCDE system is a way of remembering the signs of melanoma.  A for asymmetry:  The two halves do not match. B for border:  The edges of the growth are irregular. C for color:  A mixture of colors are present instead of an even brown color. D for diameter:  Melanomas are usually (but not always) greater than 5mm - the size of a pencil eraser. E for evolution:  The spot keeps changing in size, shape, and color.  Please check your skin once per month between visits. You can use a small mirror in front and a large mirror behind you to keep an eye on the back side or your body.   If you see any new or changing lesions before your next follow-up, please call to schedule a visit.  Please continue daily skin protection including broad spectrum sunscreen SPF 30+ to sun-exposed areas, reapplying every 2 hours as needed when you're outdoors.   Staying in the shade or wearing long sleeves, sun glasses (UVA+UVB protection) and wide brim hats (4-inch brim around the entire circumference of the hat) are also recommended for sun protection.     Topical steroids (such as triamcinolone, fluocinolone, fluocinonide, mometasone, clobetasol, halobetasol, betamethasone, hydrocortisone) can cause thinning and lightening of the skin if  they are used for too long in the same area. Your physician has selected the right strength medicine for your problem and area affected on the body. Please use your medication only as directed by your physician to prevent side effects.     If You Need Anything After Your Visit  If you have any questions or concerns for your doctor, please call our main line at [phone] and press option 4 to reach your doctor's medical assistant. If no one answers, please leave a voicemail as directed and we will return your call as soon as possible. Messages left after 4 pm will be answered the following business day.   You may also send [place] a message via [place]. We typically respond to MyChart messages within 1-2 business days.  For prescription refills, please ask your pharmacy to contact our office. Our fax number is [phone].  If you have an urgent issue when the clinic is closed that cannot wait until the next business day, you can page your doctor at the number below.    Please note that while we do our best to be available for urgent issues outside of office hours, we are not available 24/7.   If you have an urgent issue and are unable to reach [place], you may choose to seek medical care at your doctor's office, retail clinic, urgent care center, or emergency room.  If you have a medical emergency, please immediately call 911 or go to the emergency  department.  Pager Numbers  - Dr. Nehemiah Massed: [phone]  - Dr. Laurence Ferrari: [phone]  - Dr. Nicole Kindred: [phone]  In the event of inclement weather, please call our main line at [phone] for an update on the status of any delays or closures.  Dermatology Medication Tips: Please keep the boxes that topical medications come in in order to help keep track of the instructions about where and how to use these. Pharmacies typically print the medication instructions only on the boxes and not directly on the medication tubes.   If your medication is  too expensive, please contact our office at [phone] option 4 or send [place] a message through [place].   We are unable to tell what your co-pay for medications will be in advance as this is different depending on your insurance coverage. However, we may be able to find a substitute medication at lower cost or fill out paperwork to get insurance to cover a needed medication.   If a prior authorization is required to get your medication covered by your insurance company, please allow [place] 1-2 business days to complete this process.  Drug prices often vary depending on where the prescription is filled and some pharmacies may offer cheaper prices.  The website www.goodrx.com contains coupons for medications through different pharmacies. The prices here do not account for what the cost may be with help from insurance (it may be cheaper with your insurance), but the website can give you the price if you did not use any insurance.  - You can print the associated coupon and take it with your prescription to the pharmacy.  - You may also stop by our office during regular business hours and pick up a GoodRx coupon card.  - If you need your prescription sent electronically to a different pharmacy, notify our office through [facility] or by phone at [phone] option 4.     Si Usted Necesita Algo Despus de Su Visita  Tambin puede enviarnos un mensaje a travs de Pharmacist, community. Por lo general respondemos a los mensajes de MyChart en el transcurso de 1 a 2 das hbiles.  Para renovar recetas, por favor pida a su farmacia que se ponga en contacto con nuestra oficina. Harland Dingwall de fax es [place] [phone].  Si tiene un asunto urgente cuando la clnica est cerrada y que no puede esperar hasta el siguiente da hbil, puede llamar/localizar a su doctor(a) al nmero que aparece a continuacin.   Por favor, tenga en cuenta que aunque hacemos todo lo posible para estar disponibles para asuntos urgentes  fuera del horario de [place], no estamos disponibles las 24 horas del da, los 7 das de la [place].   Si tiene un problema urgente y no puede comunicarse con nosotros, puede optar por buscar atencin mdica  en el consultorio de su doctor(a), en una clnica privada, en un centro de atencin urgente o en una sala de emergencias.  Si tiene Engineering geologist, por favor llame inmediatamente al 911 o vaya a la sala de emergencias.  Nmeros de bper  - Dr. Nehemiah Massed: [phone]  - Dra. Moye: [phone]  - Dra. Nicole Kindred: [phone]  En caso de inclemencias del [place], por favor llame a Johnsie Kindred principal al [phone] para una actualizacin sobre el [place] de cualquier retraso o cierre.  Consejos para la medicacin en dermatologa: Por favor, guarde las cajas en las que vienen los medicamentos de uso tpico para ayudarle a seguir las instrucciones sobre dnde y cmo usarlos. Las [employer]  generalmente imprimen las instrucciones del medicamento slo en las cajas y no directamente en los tubos del [place].   Si su medicamento es muy caro, por favor, pngase en contacto con Zigmund Daniel llamando al [phone] y presione la opcin 4 o envenos un mensaje a travs de Pharmacist, community.   No podemos decirle cul ser su copago por los medicamentos por adelantado ya que esto es diferente dependiendo de la cobertura de su seguro. Sin embargo, es posible que podamos encontrar un medicamento sustituto a Electrical engineer un formulario para que el seguro cubra el medicamento que se considera necesario.   Si se requiere una autorizacin previa para que su compaa de seguros [place] su medicamento, por favor permtanos de 1 a 2 das hbiles para completar este proceso.  Los precios de los medicamentos varan con frecuencia dependiendo del Environmental consultant de dnde se surte la receta y alguna farmacias pueden ofrecer precios ms baratos.  El sitio web www.goodrx.com tiene cupones para medicamentos de  Airline pilot. Los precios aqu no tienen en cuenta lo que podra costar con la ayuda del seguro (puede ser ms barato con su seguro), pero el sitio web puede darle el precio si no utiliz Research scientist (physical sciences).  - Puede imprimir el cupn correspondiente y llevarlo con su receta a la farmacia.  - Tambin puede pasar por nuestra oficina durante el horario de atencin regular y Charity fundraiser una tarjeta de cupones de GoodRx.  - Si necesita que su receta se enve electrnicamente a una farmacia diferente, informe a nuestra oficina a travs de MyChart de [place] o por telfono llamando al [phone] y presione la opcin 4.

## 2021-09-07 NOTE — Progress Notes (Signed)
New Patient Visit  Subjective  Seth Calderon is a 30 y.o. male who presents for the following: Skin check.  Patient here for TBSE. No history of skin cancer of abnormal moles. He has a spot on the scalp he would like checked. He trys to avoid picking. Not itchy. He has a history of rash on the trunk and arms 2-3 months ago, wasn't itchy and worsened with topical steroids. Now clear. He has a history of tinea versicolor that he has treated with ketoconazole 2% cream and shampoo and oral fluconazole in the past. He has increased sweating of the underarms. He has tried multiple clinical strength deodorants with no improvement.    The following portions of the chart were reviewed this encounter and updated as appropriate:       Review of Systems:  No other skin or systemic complaints except as noted in HPI or Assessment and Plan.  Objective  Well appearing patient in no apparent distress; mood and affect are within normal limits.  A full examination was performed including scalp, head, eyes, ears, nose, lips, neck, chest, axillae, abdomen, back, buttocks, bilateral upper extremities, bilateral lower extremities, hands, feet, fingers, toes, fingernails, and toenails. All findings within normal limits unless otherwise noted below.  Right Posterior Shoulder, Left medial shoulder Pink crusted excoriations of the R post shoulder and left medial shoulder  Left lateral chest 10.0 x 4.0 mm med dark brown macule  Left Lower Back 6.0 x 3.0 mm med brown macule, darker center  Abdomen Regular brown macules and papules.  Back Regular brown macules and papules.                trunk Clear today.   Scalp Pink excoriated papules of the right vertex scalp.  feet Right foot dorsum with healing pink papule.  Pt states keeps getting itchy bumps on feet and ankles.  He goes barefoot at home and outdoors.  He has several pet dogs  bilateral axilla Increased moisture of the  bilateral axilla.    Assessment & Plan  Excoriation Right Posterior Shoulder, Left medial shoulder  Start mupirocin 2% ointment Apply to open areas BID, cover with bandage until healed. Avoid picking.   mupirocin ointment (BACTROBAN) 2 % - Right Posterior Shoulder, Left medial shoulder Apply to open areas on body 1-2 times a day until healed. Cover with bandage and avoid picking.  Nevus (4) Abdomen; Left lateral chest; Left Lower Back; Back  Benign-appearing.  Observation.  Call clinic for new or changing moles.  Recommend daily use of broad spectrum spf 30+ sunscreen to sun-exposed areas.   Tinea versicolor trunk  Clear today.  Continue ketoconazole 2% cream to AAs prn flares.   Recommend OTC Z Bar in shower for maintenance.   Patient has used oral fluconazole for severe flares in the past. 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  It is generally asymptomatic. The rash is due to overgrowth of a common type of yeast present on everyone's skin and it is not contagious.  It tends to flare more in the summer due to increased sweating on trunk.  After rash is treated, the scaliness will resolve, but the discoloration will take longer to return to normal pigmentation. The periodic use of an OTC medicated soap/shampoo with zinc or selenium sulfide can be helpful to prevent yeast overgrowth and recurrence.     ketoconazole (  NIZORAL) 2 % cream - trunk Apply to affected areas body 1-2 times a day prn flares of tinea versicolor.  Folliculitis Scalp  Start Ketoconazole 2% shampoo massage into scalp daily, let sit several minutes before rinsing  Start Clindamycin solution Apply to AA scalp qd/bid dsp 106mL 3Rf.  Start fluocinonide scalp solution Spot treat AA scalp qd/bid prn flares 2Rf.  Avoid picking  ketoconazole (NIZORAL) 2 % shampoo - Scalp Massage  into scalp, let sit several minutes before rinsing. May also use as a body was for tinea versicolor.  fluocinonide (LIDEX) 0.05 % external solution - Scalp Spot treat affected areas scalp 1-2 times a day as needed for flares.  clindamycin (CLEOCIN T) 1 % external solution - Scalp Apply to affected areas scalp 1-2 times a day as needed.  Bug bite without infection, initial encounter feet  Mupirocin ointment bid until healed fluocinonide solution qd/bid prn itch.   Possible flea bites, check dogs, may need to treat house.  Primary focal hyperhidrosis bilateral axilla  Start Qbrexa Use one pad and wipe under each axilla at night after showering as directed dsp #30 5Rf.  Discussed oral option if not improving with topical. Not great option at this time since his sweating is worse with exertion.  Glycopyrronium Tosylate (QBREXZA) 2.4 % PADS - bilateral axilla Wipe one pad to each underarm every evening after showering. Skin cancer screening performed today.  Actinic Damage - chronic, secondary to cumulative UV radiation exposure/sun exposure over time - diffuse scaly erythematous macules with underlying dyspigmentation - Recommend daily broad spectrum sunscreen SPF 30+ to sun-exposed areas, reapply every 2 hours as needed.  - Recommend staying in the shade or wearing long sleeves, sun glasses (UVA+UVB protection) and wide brim hats (4-inch brim around the entire circumference of the hat). - Call for new or changing lesions.  Lentigines - Scattered tan macules, including left medial iris, 102mm brown macule, followed by optometrist.  - Due to sun exposure - Benign-appering, observe - Recommend daily broad spectrum sunscreen SPF 30+ to sun-exposed areas, reapply every 2 hours as needed. - Call for any changes  Melanocytic Nevi - Tan-brown and/or pink-flesh-colored symmetric macules and papules, see photos - Benign appearing on exam today - Observation - Call clinic for new or  changing moles - Recommend daily use of broad spectrum spf 30+ sunscreen to sun-exposed areas.   Return in about 1 year (around 09/07/2022) for TBSE.  IJamesetta Orleans, CMA, am acting as scribe for Brendolyn Patty, MD .  Documentation: I have reviewed the above documentation for accuracy and completeness, and I agree with the above.  Brendolyn Patty MD

## 2021-09-17 ENCOUNTER — Encounter: Payer: Self-pay | Admitting: Otolaryngology

## 2021-09-18 ENCOUNTER — Other Ambulatory Visit: Payer: Self-pay | Admitting: Otolaryngology

## 2021-09-18 DIAGNOSIS — R221 Localized swelling, mass and lump, neck: Secondary | ICD-10-CM

## 2021-09-22 DIAGNOSIS — J45909 Unspecified asthma, uncomplicated: Secondary | ICD-10-CM | POA: Insufficient documentation

## 2021-09-22 DIAGNOSIS — I10 Essential (primary) hypertension: Secondary | ICD-10-CM | POA: Insufficient documentation

## 2021-09-22 DIAGNOSIS — F419 Anxiety disorder, unspecified: Secondary | ICD-10-CM | POA: Insufficient documentation

## 2021-10-01 ENCOUNTER — Ambulatory Visit
Admission: RE | Admit: 2021-10-01 | Discharge: 2021-10-01 | Disposition: A | Discharge status: Home / Self Care | Payer: BC Managed Care – PPO | Source: Ambulatory Visit | Attending: Otolaryngology | Admitting: Otolaryngology

## 2021-10-01 ENCOUNTER — Other Ambulatory Visit: Payer: Self-pay

## 2021-10-01 DIAGNOSIS — R221 Localized swelling, mass and lump, neck: Secondary | ICD-10-CM | POA: Insufficient documentation

## 2021-10-01 IMAGING — CT CT NECK W/ CM
5 series · 16 of 33 positions shown, 18 images · IV contrast (omnipaque)
Comparison: None.
COMPARISON: None.

Addendum:
CLINICAL DATA: Neck mass

EXAM:
CT NECK WITH CONTRAST
TECHNIQUE: Multidetector CT imaging of the neck was performed using the
standard protocol following the bolus administration of intravenous
contrast.
CONTRAST:  75mL OMNIPAQUE IOHEXOL 350 MG/ML SOLN

[Series 2: axial neck neck (person_name) 2.00 · axial · 0.63mm/px · z∈[-682,-592]mm · 2 of 135 slices shown]
[im 45/135  bone]
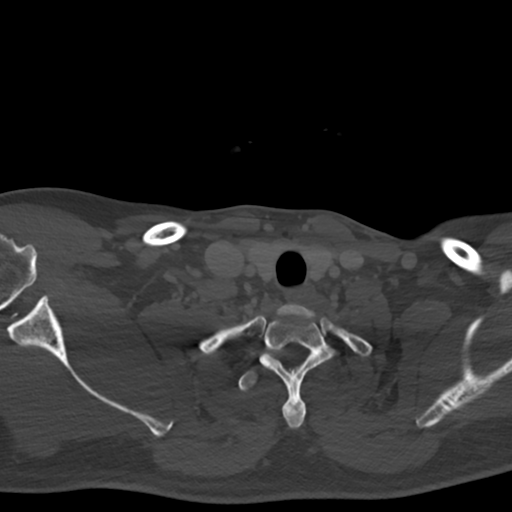
[im 90/135  bone]
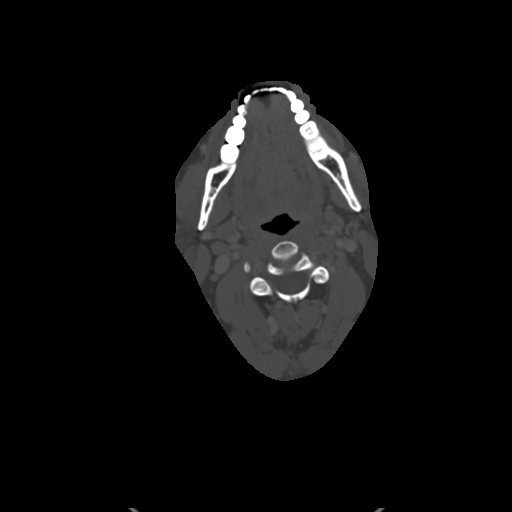

[Series 3: axial bone neck 2.00 · axial · 0.63mm/px · z∈[-704,-570]mm · 3 of 135 slices shown, 4 images]
[im 34/135  soft-tissue]
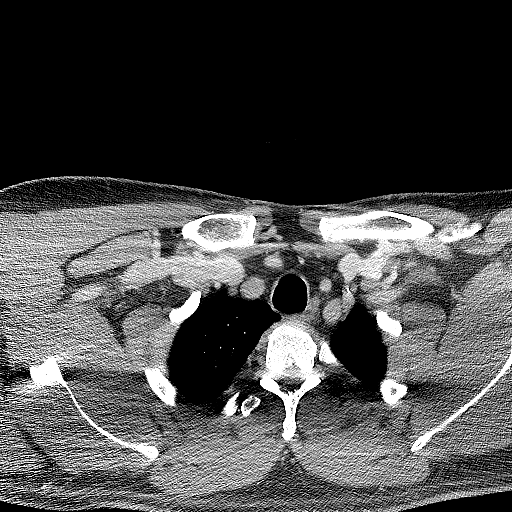
[im 34/135  bone]
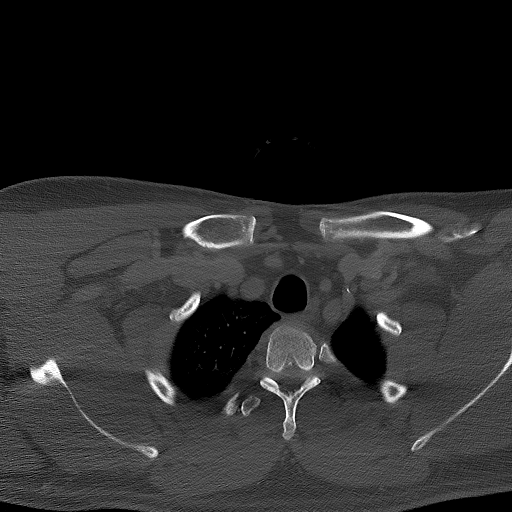
[im 68/135  bone]
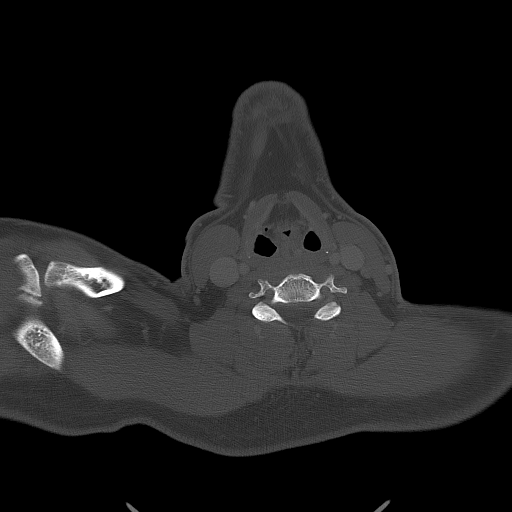
[im 101/135  bone]
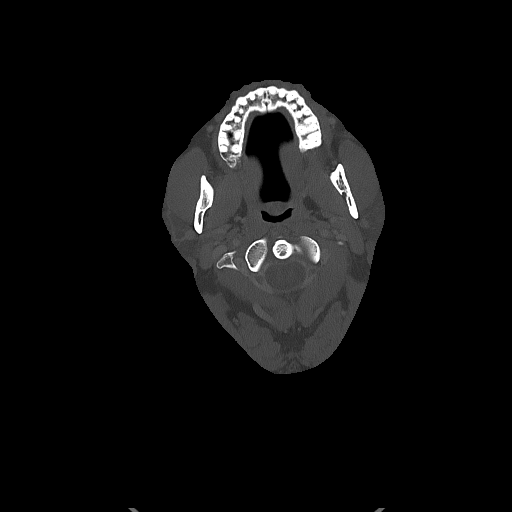

[Series 4: coronal neck neck (person_name) 2.00 cor · coronal · 0.53mm/px · 3 of 147 slices shown]
[im 30/147  bone]
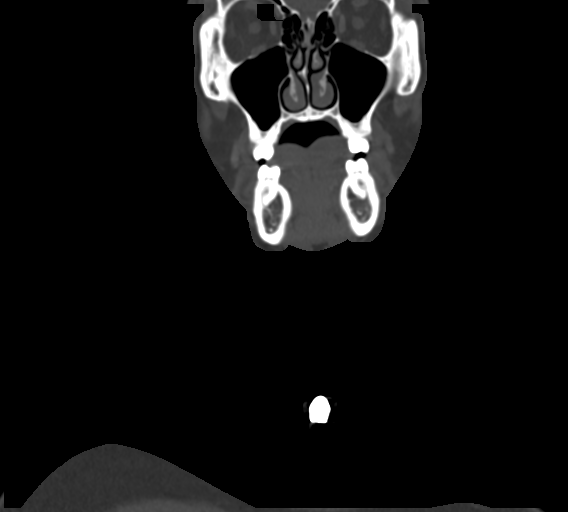
[im 59/147  bone]
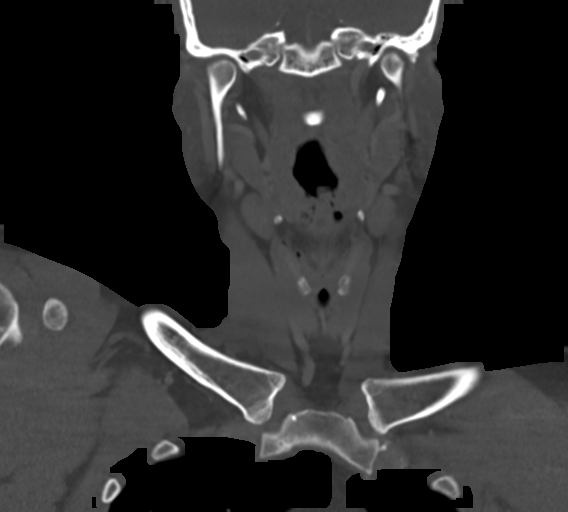
[im 88/147  bone]
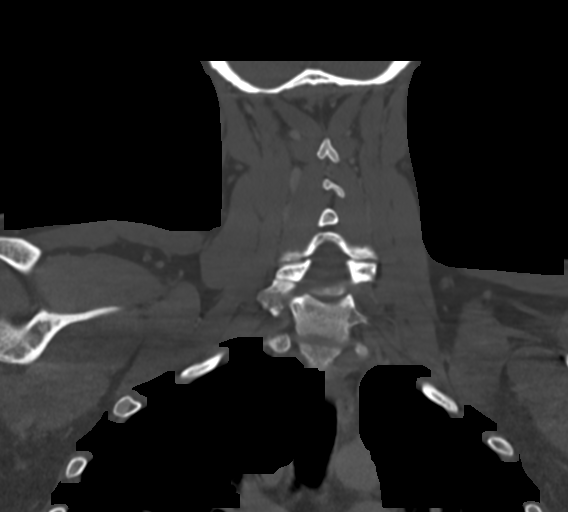

[Series 6: sagittal neck neck (person_name) 2.00 sag · sagittal · 0.53mm/px · 5 of 150 slices shown, 6 images]
[im 50/150  bone]
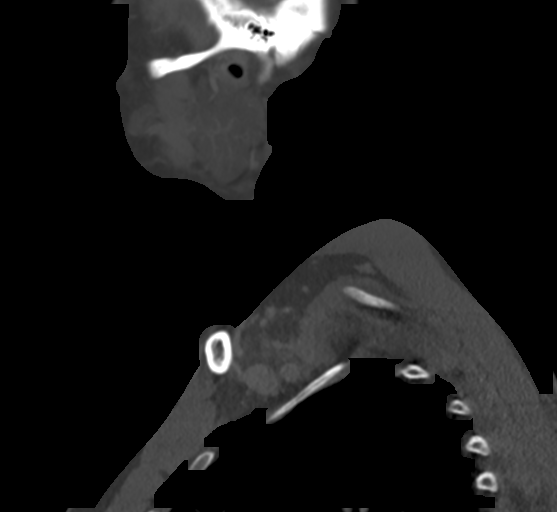
[im 63/150  bone]
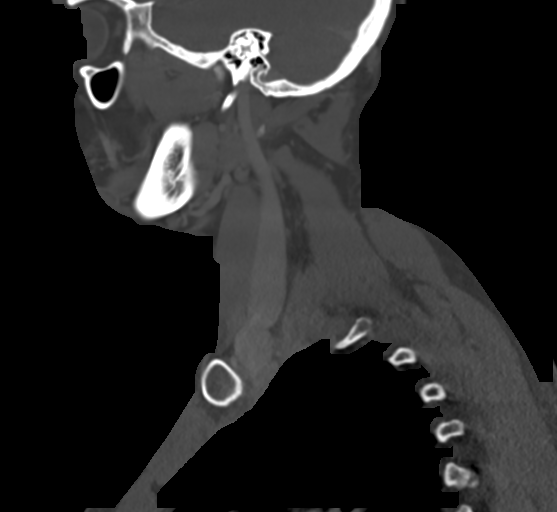
[im 75/150  soft-tissue]
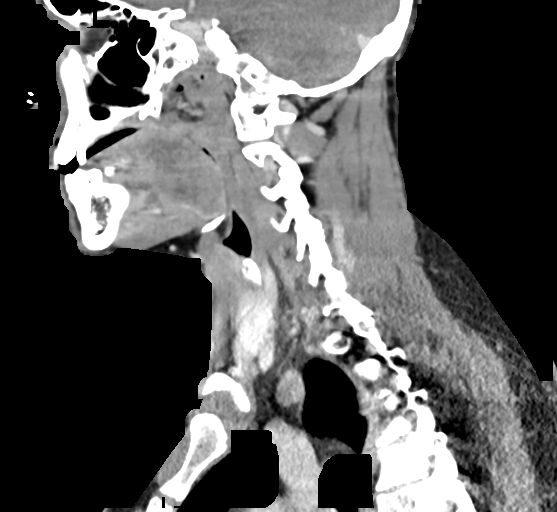
[im 75/150  bone]
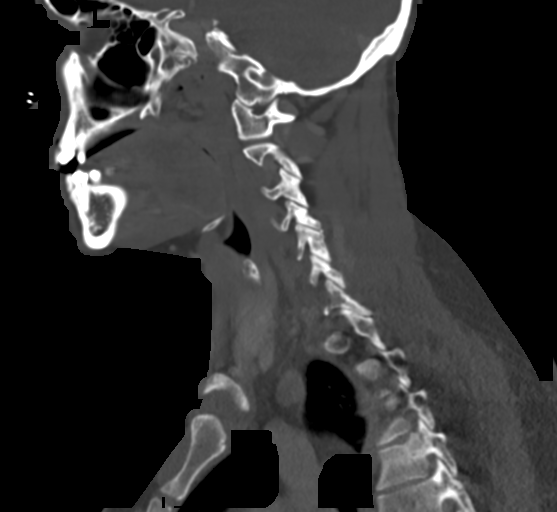
[im 87/150  bone]
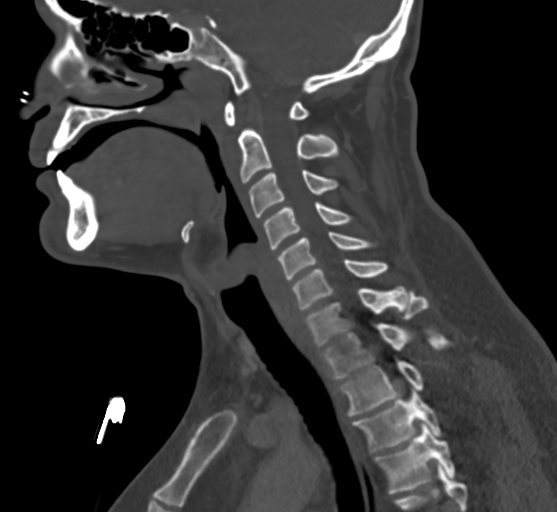
[im 100/150  bone]
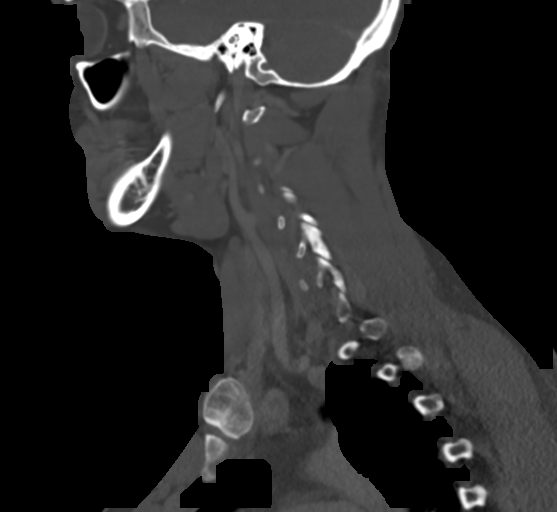

[Series 8: ax oropharynx neck neck (person_name) 2.00 ax · axial · 0.57mm/px · z∈[-704,-570]mm · 3 of 135 slices shown]
[im 34/135  bone]
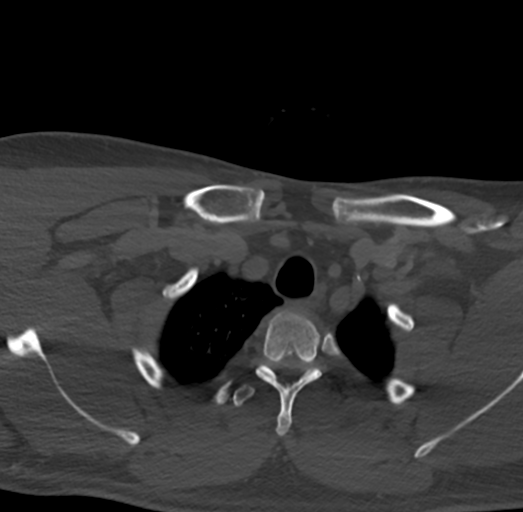
[im 68/135  bone]
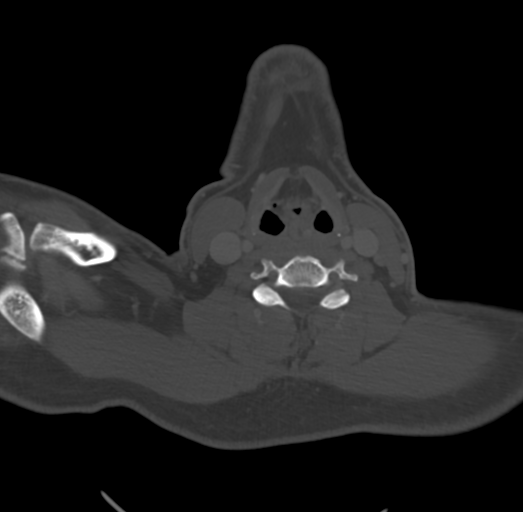
[im 101/135  bone]
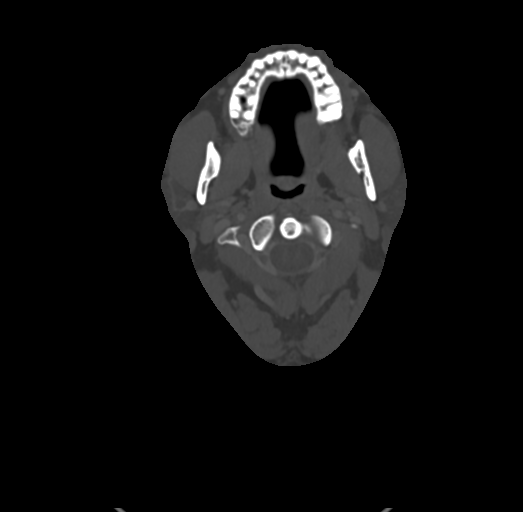

[16 of 33 positions shown; findings below may reference images not displayed]

FINDINGS: Pharynx and larynx: Unremarkable.  No mass or swelling.

Salivary glands: Parotid and submandibular glands are unremarkable.

Thyroid: Normal.

Lymph nodes: No enlarged or abnormal density nodes.

Vascular: Major neck vessels are patent.

Limited intracranial: No abnormal enhancement.

Visualized orbits: Unremarkable.

Mastoids and visualized paranasal sinuses: No significant
opacification.

Skeleton: No acute or aggressive process.

Upper chest: Negative.

Other: None.
IMPRESSION: No neck mass or adenopathy.

ADDENDUM:
Prior reports from [XZ] and [XZ] are submitted. No images. By
report, there was a lesion between the inferior left thyroid and the
left brachiocephalic vein. There is no lesion at this location on
this study.

*** End of Addendum ***
FINDINGS: Pharynx and larynx: Unremarkable.  No mass or swelling.

Salivary glands: Parotid and submandibular glands are unremarkable.

Thyroid: Normal.

Lymph nodes: No enlarged or abnormal density nodes.

Vascular: Major neck vessels are patent.

Limited intracranial: No abnormal enhancement.

Visualized orbits: Unremarkable.

Mastoids and visualized paranasal sinuses: No significant
opacification.

Skeleton: No acute or aggressive process.

Upper chest: Negative.

Other: None.
IMPRESSION: No neck mass or adenopathy.

## 2021-10-01 MED ORDER — IOHEXOL 350 MG/ML SOLN
75.0000 mL | Freq: Once | INTRAVENOUS | Status: AC | PRN
  Administered 2021-10-01: 14:00:00 75 mL via INTRAVENOUS

## 2021-10-13 ENCOUNTER — Other Ambulatory Visit: Payer: BC Managed Care – PPO

## 2021-10-29 DIAGNOSIS — Z9049 Acquired absence of other specified parts of digestive tract: Secondary | ICD-10-CM | POA: Insufficient documentation

## 2021-10-29 DIAGNOSIS — R300 Dysuria: Secondary | ICD-10-CM | POA: Insufficient documentation

## 2021-11-02 ENCOUNTER — Ambulatory Visit: Payer: BC Managed Care – PPO | Attending: Family Medicine | Admitting: Physical Therapy

## 2021-11-02 ENCOUNTER — Other Ambulatory Visit: Payer: Self-pay

## 2021-11-02 DIAGNOSIS — G8929 Other chronic pain: Secondary | ICD-10-CM | POA: Diagnosis present

## 2021-11-02 DIAGNOSIS — M25561 Pain in right knee: Secondary | ICD-10-CM | POA: Diagnosis present

## 2021-11-02 DIAGNOSIS — R293 Abnormal posture: Secondary | ICD-10-CM | POA: Insufficient documentation

## 2021-11-02 DIAGNOSIS — R2689 Other abnormalities of gait and mobility: Secondary | ICD-10-CM | POA: Insufficient documentation

## 2021-11-02 DIAGNOSIS — M62838 Other muscle spasm: Secondary | ICD-10-CM | POA: Insufficient documentation

## 2021-11-02 NOTE — Patient Instructions (Signed)
°  Proper body mechanics with getting out of a chair to decrease strain  on back &pelvic floor   Avoid holding your breath when Getting out of the chair:  Scoot to front part of chair chair Heels behind knees, feet are hip width apart, nose over toes  Inhale like you are smelling roses Exhale to stand   __   Avoid straining pelvic floor, abdominal muscles , spine  Use log rolling technique instead of getting out of bed with your neck or the sit-up     Log rolling into and out of bed   Log rolling into and out of bed If getting out of bed on R side, Bent knees, scoot hips/ shoulder to L  Raise R arm completely overhead, rolling onto armpit  Then lower bent knees to bed to get into complete side lying position  Then drop legs off bed, and push up onto R elbow/forearm, and use L hand to push onto the bed  __  Sidelying with hand on elbow, circles back to lengthen flank Pillow between knees   ___ Less time in recliner,  Upright sitting with feet on floor,

## 2021-11-03 ENCOUNTER — Emergency Department
Admission: EM | Admit: 2021-11-03 | Discharge: 2021-11-04 | Disposition: A | Discharge status: Home / Self Care | Payer: BC Managed Care – PPO | Attending: Emergency Medicine | Admitting: Emergency Medicine

## 2021-11-03 ENCOUNTER — Other Ambulatory Visit: Payer: Self-pay

## 2021-11-03 ENCOUNTER — Emergency Department: Payer: BC Managed Care – PPO

## 2021-11-03 DIAGNOSIS — E871 Hypo-osmolality and hyponatremia: Secondary | ICD-10-CM | POA: Insufficient documentation

## 2021-11-03 DIAGNOSIS — Z20822 Contact with and (suspected) exposure to covid-19: Secondary | ICD-10-CM | POA: Diagnosis not present

## 2021-11-03 DIAGNOSIS — R109 Unspecified abdominal pain: Secondary | ICD-10-CM

## 2021-11-03 DIAGNOSIS — F840 Autistic disorder: Secondary | ICD-10-CM | POA: Insufficient documentation

## 2021-11-03 DIAGNOSIS — R1032 Left lower quadrant pain: Secondary | ICD-10-CM | POA: Insufficient documentation

## 2021-11-03 DIAGNOSIS — R531 Weakness: Secondary | ICD-10-CM | POA: Diagnosis not present

## 2021-11-03 DIAGNOSIS — I10 Essential (primary) hypertension: Secondary | ICD-10-CM | POA: Diagnosis not present

## 2021-11-03 DIAGNOSIS — R2689 Other abnormalities of gait and mobility: Secondary | ICD-10-CM | POA: Diagnosis not present

## 2021-11-03 DIAGNOSIS — R112 Nausea with vomiting, unspecified: Secondary | ICD-10-CM | POA: Insufficient documentation

## 2021-11-03 DIAGNOSIS — D72829 Elevated white blood cell count, unspecified: Secondary | ICD-10-CM | POA: Insufficient documentation

## 2021-11-03 DIAGNOSIS — G6289 Other specified polyneuropathies: Secondary | ICD-10-CM

## 2021-11-03 HISTORY — DX: Essential (primary) hypertension: I10

## 2021-11-03 LAB — BASIC METABOLIC PANEL
Anion gap: 11 (ref 5–15)
BUN: 20 mg/dL (ref 6–20)
CO2: 26 mmol/L (ref 22–32)
Calcium: 9.4 mg/dL (ref 8.9–10.3)
Chloride: 97 mmol/L — ABNORMAL LOW (ref 98–111)
Creatinine, Ser: 1.09 mg/dL (ref 0.61–1.24)
GFR, Estimated: >60 mL/min (ref >60)
Glucose, Bld: 117 mg/dL — ABNORMAL HIGH (ref 70–99)
Potassium: 3.3 mmol/L — ABNORMAL LOW (ref 3.5–5.1)
Sodium: 134 mmol/L — ABNORMAL LOW (ref 135–145)

## 2021-11-03 LAB — CBC
HCT: 42.8 % (ref 39.0–52.0)
Hemoglobin: 14.7 g/dL (ref 13.0–17.0)
MCH: 31.2 pg (ref 26.0–34.0)
MCHC: 34.3 g/dL (ref 30.0–36.0)
MCV: 90.9 fL (ref 80.0–100.0)
Platelets: 443 10*3/uL — ABNORMAL HIGH (ref 150–400)
RBC: 4.71 MIL/uL (ref 4.22–5.81)
RDW: 11.6 % (ref 11.5–15.5)
WBC: 12.1 10*3/uL — ABNORMAL HIGH (ref 4.0–10.5)
nRBC: 0 % (ref 0.0–0.2)

## 2021-11-03 LAB — LACTIC ACID, PLASMA: Lactic Acid, Venous: 0.6 mmol/L (ref 0.5–1.9)

## 2021-11-03 LAB — URINALYSIS, ROUTINE W REFLEX MICROSCOPIC
Bilirubin Urine: NEGATIVE
Glucose, UA: NEGATIVE mg/dL
Hgb urine dipstick: NEGATIVE
Ketones, ur: NEGATIVE mg/dL
Leukocytes,Ua: NEGATIVE
Nitrite: NEGATIVE
Protein, ur: NEGATIVE mg/dL
Specific Gravity, Urine: 1.015 (ref 1.005–1.030)
pH: 7 (ref 5.0–8.0)

## 2021-11-03 LAB — HEPATIC FUNCTION PANEL
ALT: 27 U/L (ref 0–44)
AST: 27 U/L (ref 15–41)
Albumin: 4.8 g/dL (ref 3.5–5.0)
Alkaline Phosphatase: 58 U/L (ref 38–126)
Bilirubin, Direct: <0.1 mg/dL (ref 0.0–0.2)
Total Bilirubin: 0.7 mg/dL (ref 0.3–1.2)
Total Protein: 7.6 g/dL (ref 6.5–8.1)

## 2021-11-03 LAB — APTT: aPTT: 33 seconds (ref 24–36)

## 2021-11-03 LAB — LIPASE, BLOOD: Lipase: 33 U/L (ref 11–51)

## 2021-11-03 LAB — PROTIME-INR
INR: 1 (ref 0.8–1.2)
Prothrombin Time: 12.7 seconds (ref 11.4–15.2)

## 2021-11-03 IMAGING — CT CT ABD-PELV W/ CM
2 of 5 series · 15 of 46 positions shown, 17 images · IV contrast (APPLIED)
Comparison: None.

CLINICAL DATA: History of recent appendectomy with abdominal pain,
initial encounter

EXAM:
CT ABDOMEN AND PELVIS WITH CONTRAST
TECHNIQUE: Multidetector CT imaging of the abdomen and pelvis was performed
using the standard protocol following bolus administration of
intravenous contrast.

[Series 5: delay · axial · delayed · 0.98mm/px · z∈[-924,-439]mm · 12 of 115 slices shown, 14 images]
[im 9/115  soft-tissue]
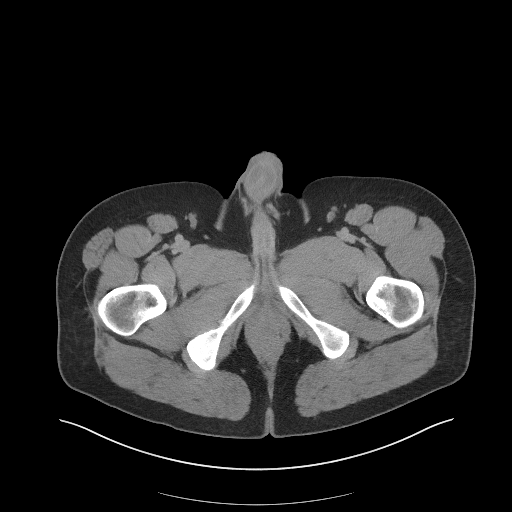
[im 9/115  bone]
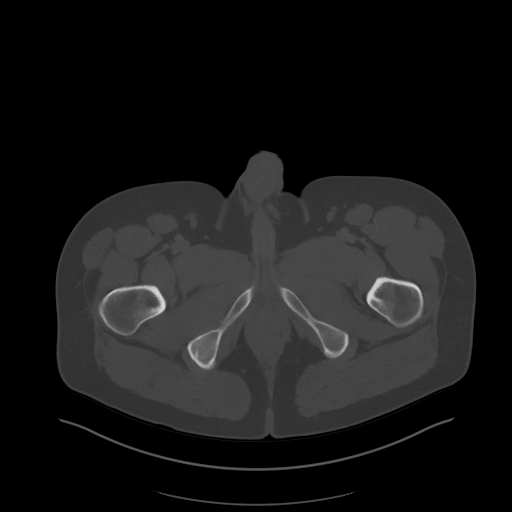
[im 18/115  soft-tissue]
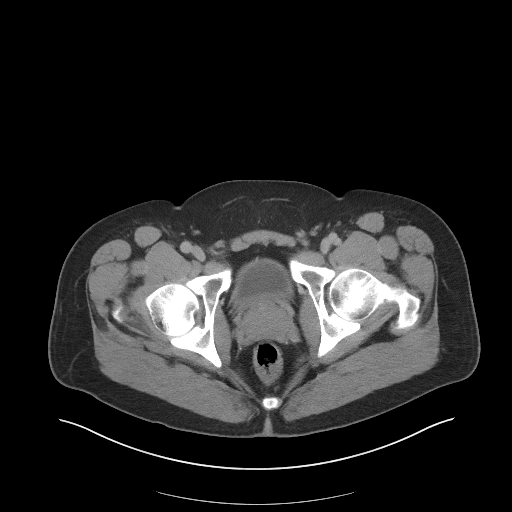
[im 27/115  soft-tissue]
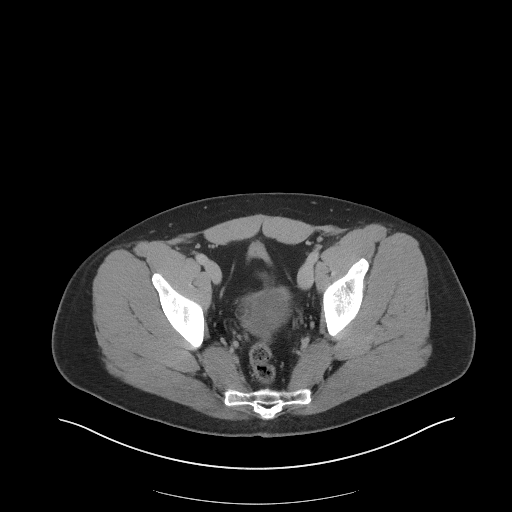
[im 36/115  soft-tissue]
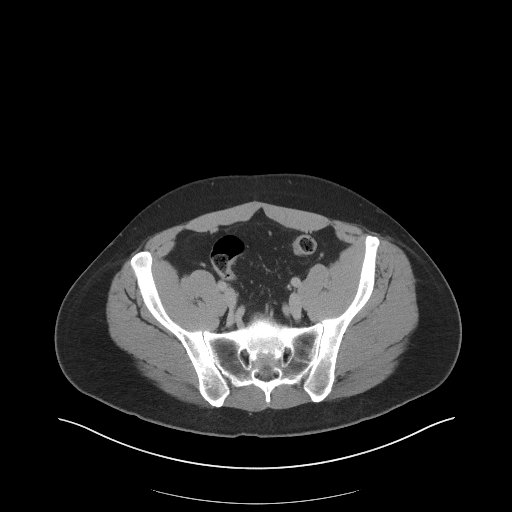
[im 44/115  soft-tissue]
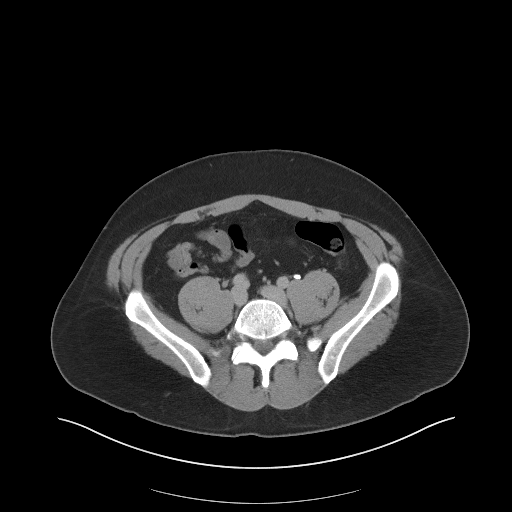
[im 53/115  soft-tissue]
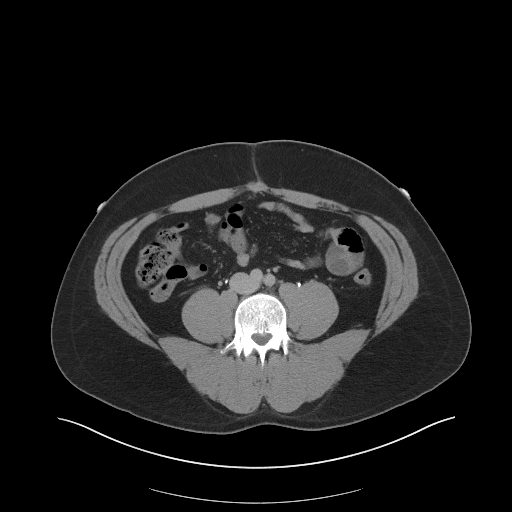
[im 62/115  soft-tissue]
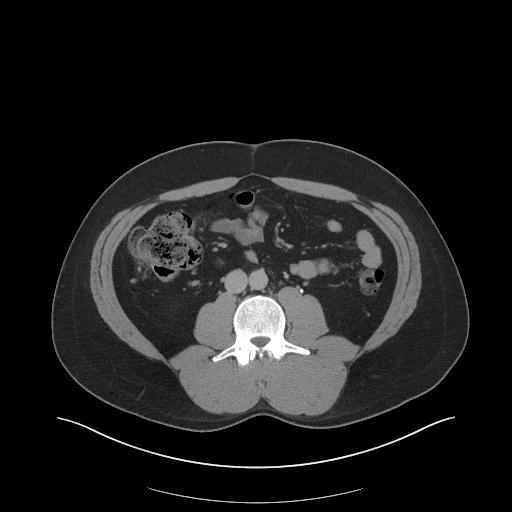
[im 71/115  soft-tissue]
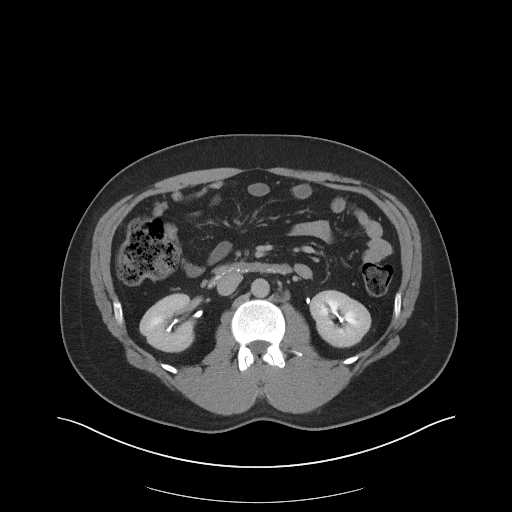
[im 79/115  soft-tissue]
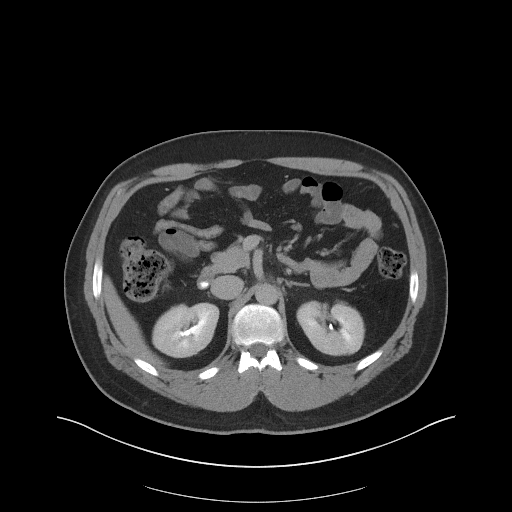
[im 79/115  bone]
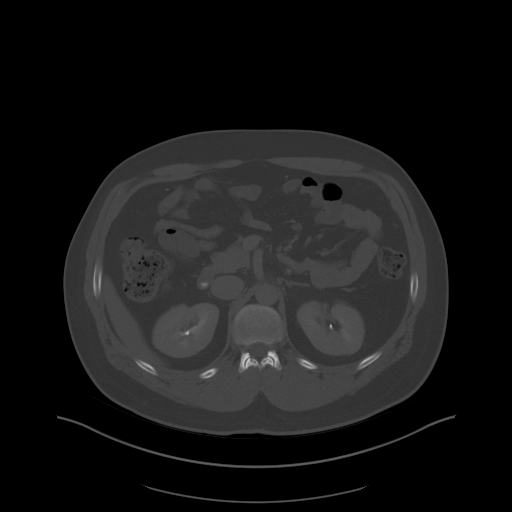
[im 88/115  soft-tissue]
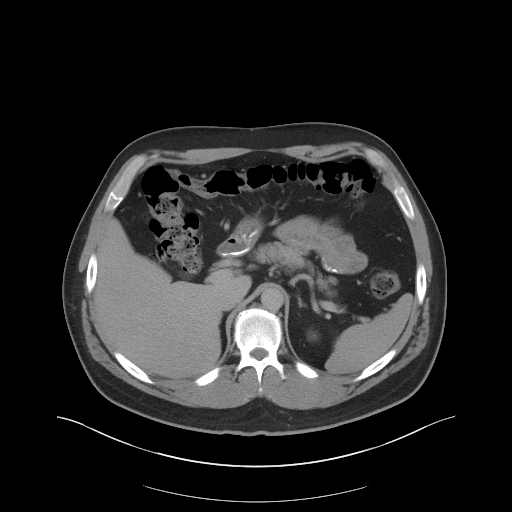
[im 97/115  soft-tissue]
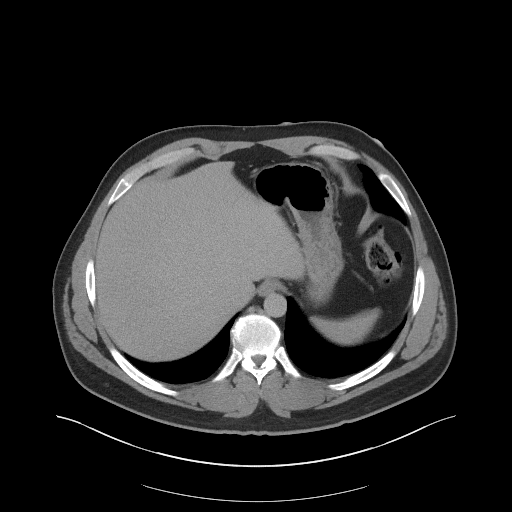
[im 106/115  soft-tissue]
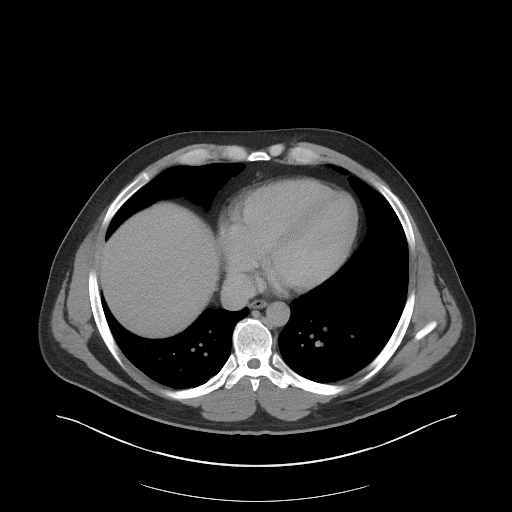

[Series 7: coronal st · coronal · 0.87mm/px · 3 of 105 slices shown]
[im 35/105  soft-tissue]
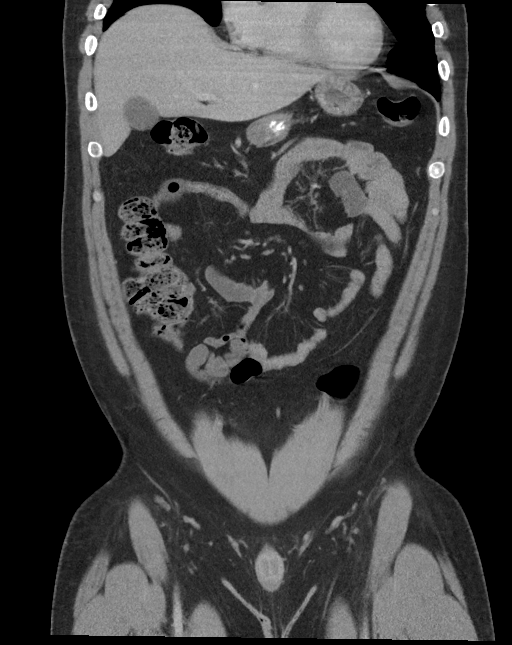
[im 47/105  soft-tissue]
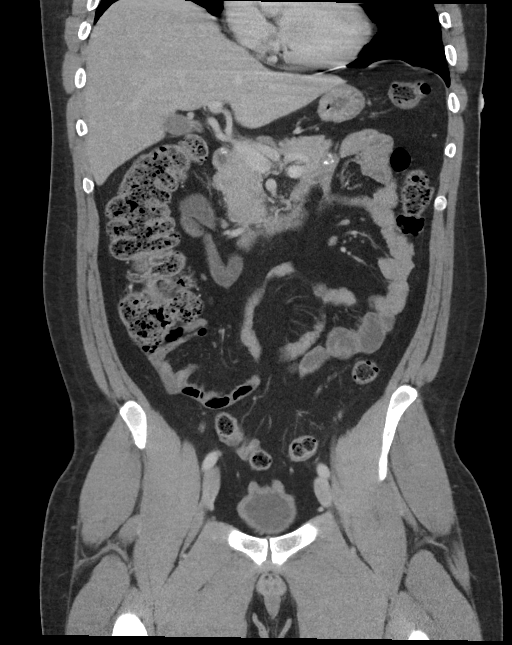
[im 58/105  soft-tissue]
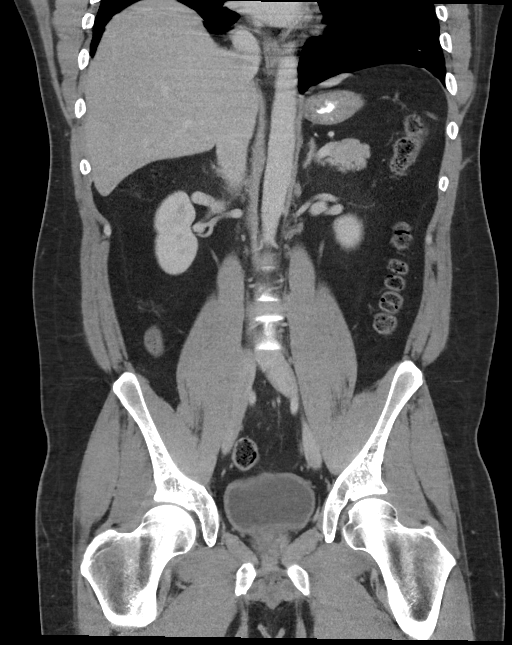

[15 of 46 positions shown; findings below may reference images not displayed]

RADIATION DOSE REDUCTION: This exam was performed according to the
departmental dose-optimization program which includes automated
exposure control, adjustment of the mA and/or kV according to
patient size and/or use of iterative reconstruction technique.

CONTRAST:  100mL OMNIPAQUE IOHEXOL 350 MG/ML SOLN
FINDINGS: Lower chest: No acute abnormality.

Hepatobiliary: No focal liver abnormality is seen. No gallstones,
gallbladder wall thickening, or biliary dilatation.

Pancreas: Unremarkable. No pancreatic ductal dilatation or
surrounding inflammatory changes.

Spleen: Normal in size without focal abnormality.

Adrenals/Urinary Tract: Adrenal glands are within normal limits
bilaterally. Kidneys demonstrate a normal enhancement pattern. No
renal calculi or obstructive changes are seen. The bladder is well
distended with urine.

Stomach/Bowel: The appendix has been surgically removed. Colon shows
no obstructive or inflammatory changes. No small bowel abnormality
is noted. Stomach is within normal limits.

Vascular/Lymphatic: No significant vascular findings are present. No
enlarged abdominal or pelvic lymph nodes.

Reproductive: Prostate is unremarkable.

Other: No abdominal wall hernia or abnormality. No abdominopelvic
ascites.

Musculoskeletal: No acute or significant osseous findings.
IMPRESSION: Status post appendectomy without complicating factors.

No other focal abnormality is noted.

## 2021-11-03 IMAGING — CT CT HEAD W/O CM
4 series · 16 of 47 positions shown, 18 images · non-contrast
Comparison: None.

CLINICAL DATA: Neurological deficit



[Series 2: head bone · axial · 0.44mm/px · z∈[-126,-94]mm · 3 of 80 slices shown]
[im 8/80  bone]
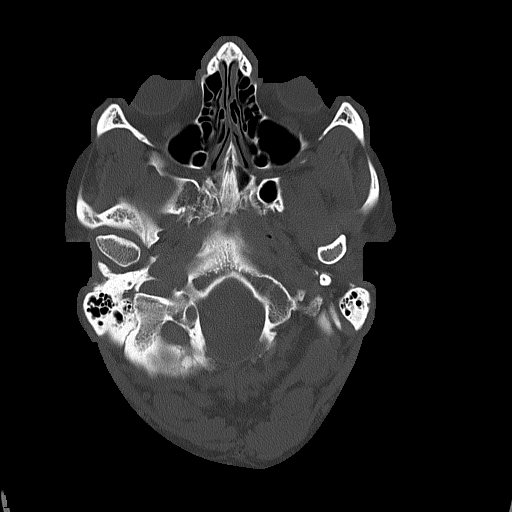
[im 16/80  bone]
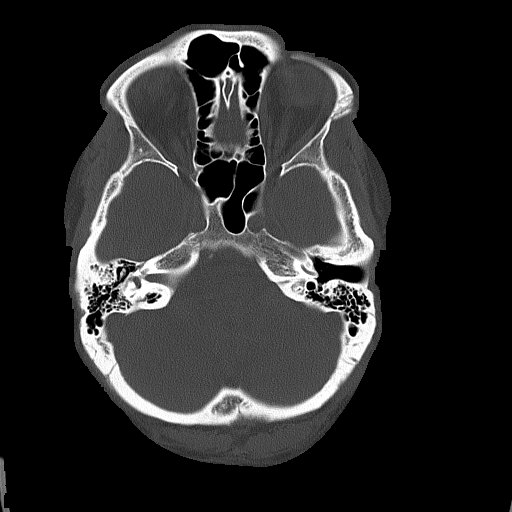
[im 24/80  bone]
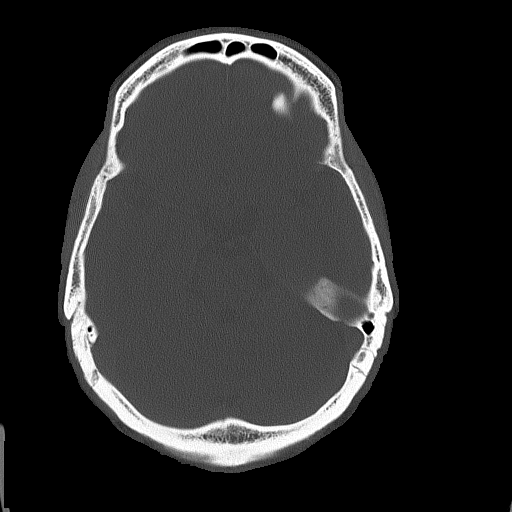

[Series 3: head wo · axial · 0.44mm/px · z∈[-125,-5]mm · 7 of 32 slices shown, 9 images]
[im 4/32  brain]
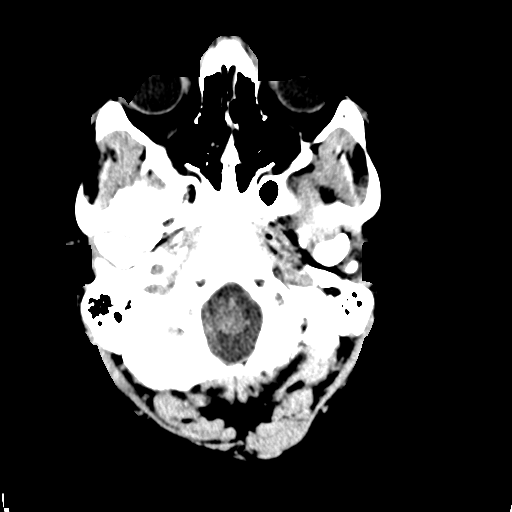
[im 4/32  bone]
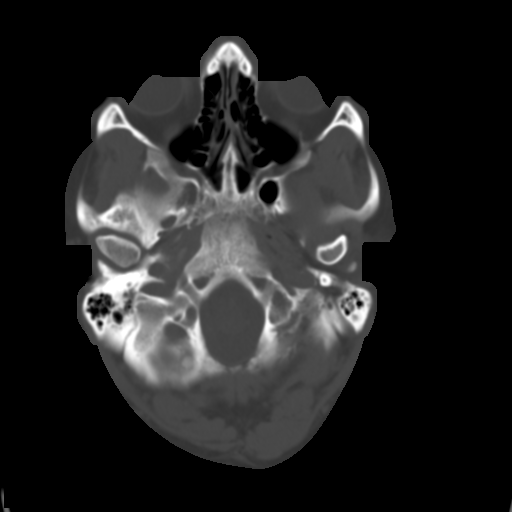
[im 8/32  brain]
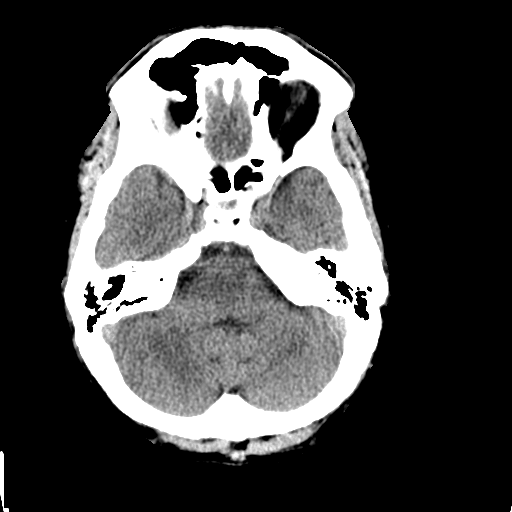
[im 12/32  brain]
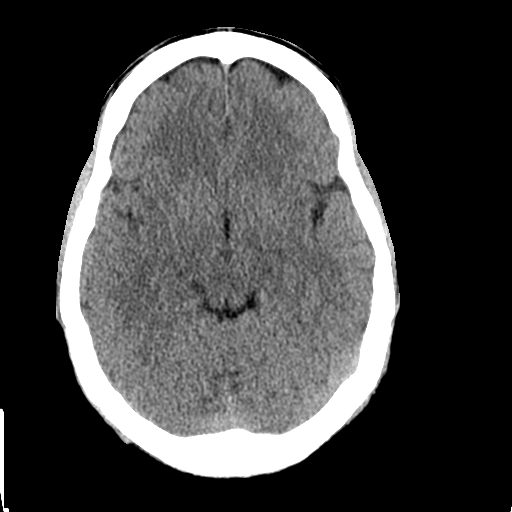
[im 16/32  brain]
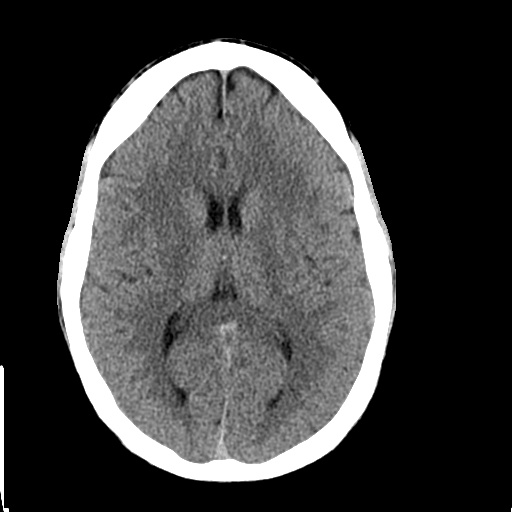
[im 20/32  brain]
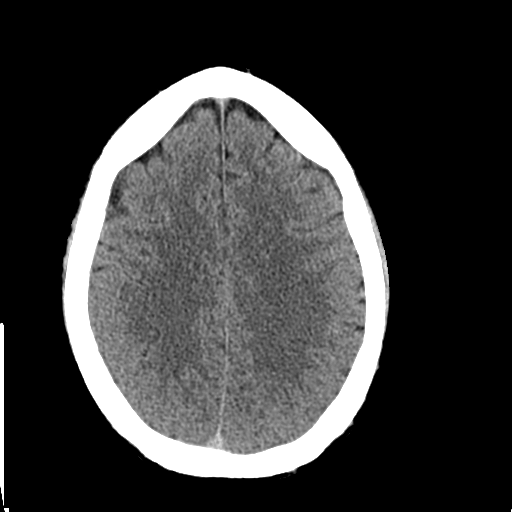
[im 20/32  bone]
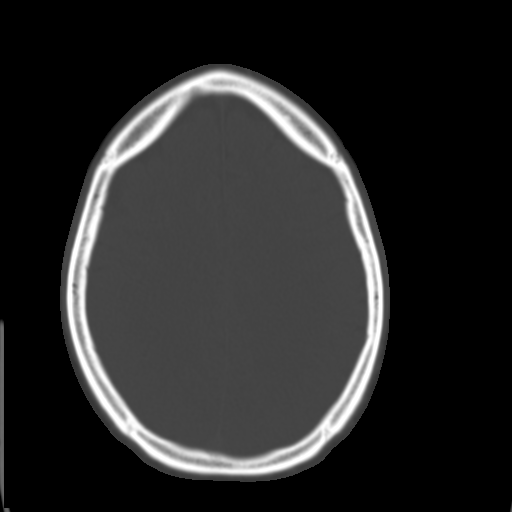
[im 24/32  brain]
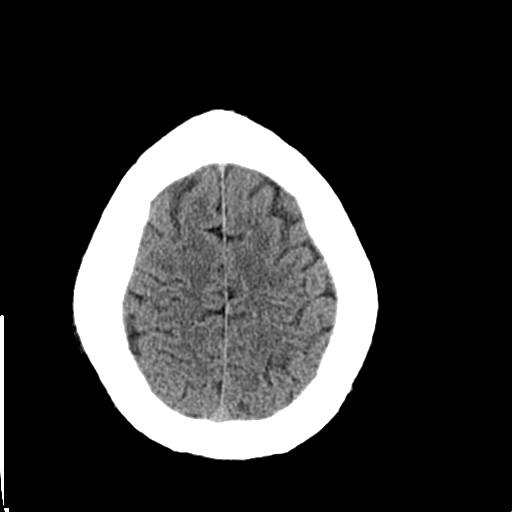
[im 28/32  brain]
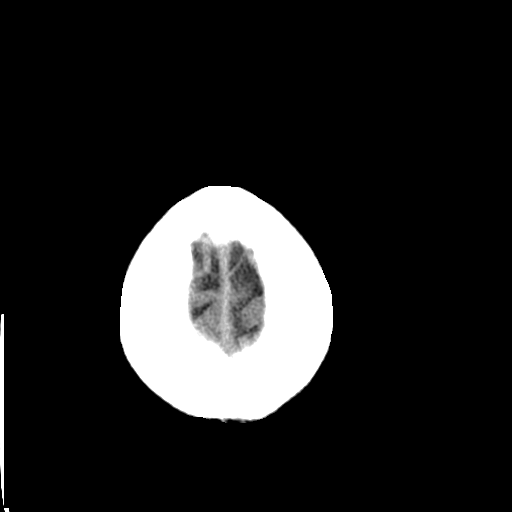

[Series 4: coronal soft tissue · coronal · 0.31mm/px · 3 of 69 slices shown]
[im 23/69  brain]
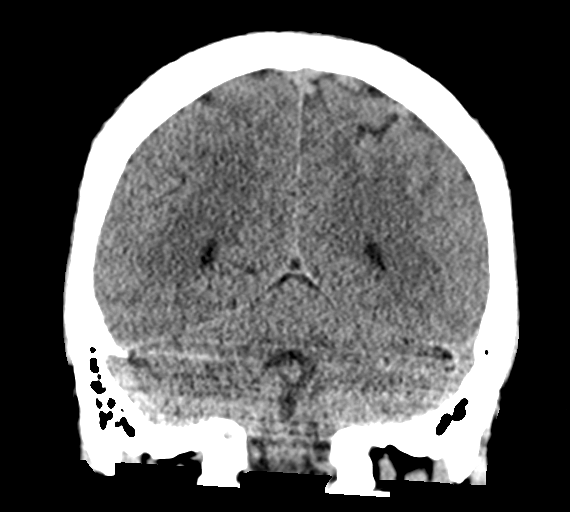
[im 31/69  brain]
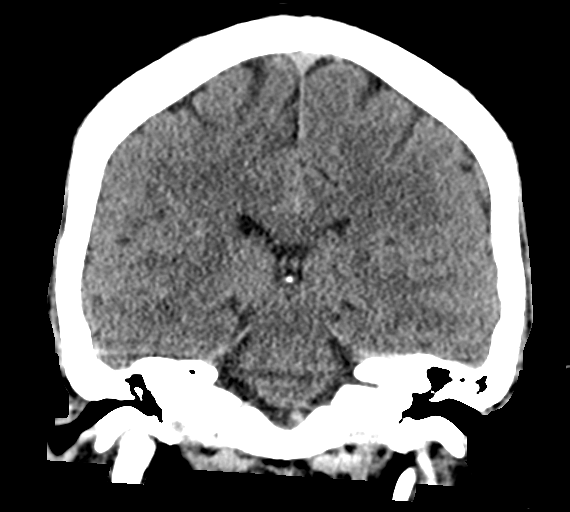
[im 38/69  brain]
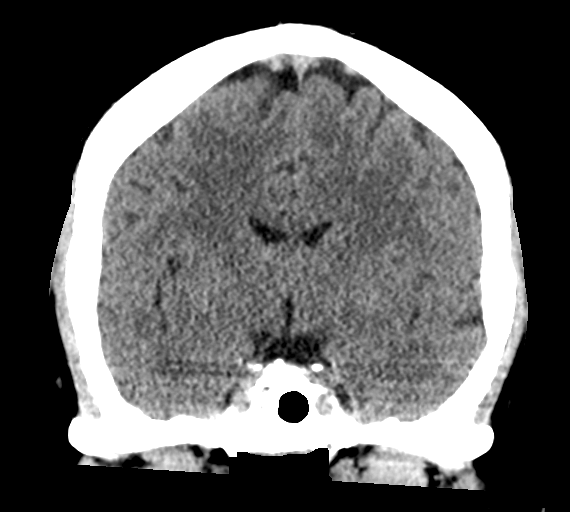

[Series 5: sagittal soft tissue · sagittal · 0.31mm/px · 3 of 58 slices shown]
[im 20/58  brain]
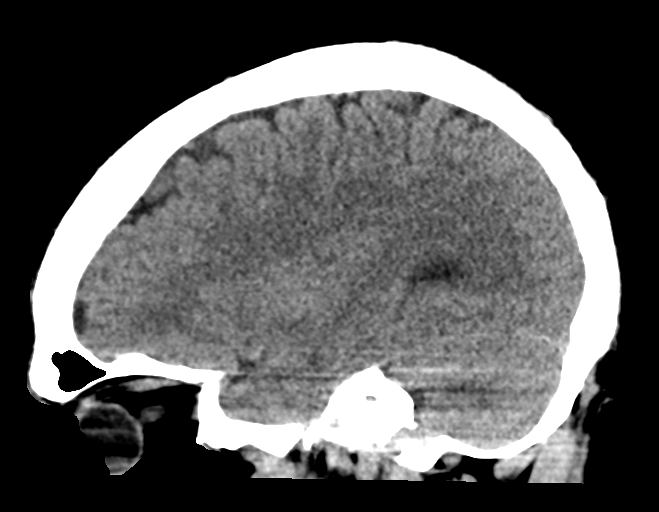
[im 29/58  brain]
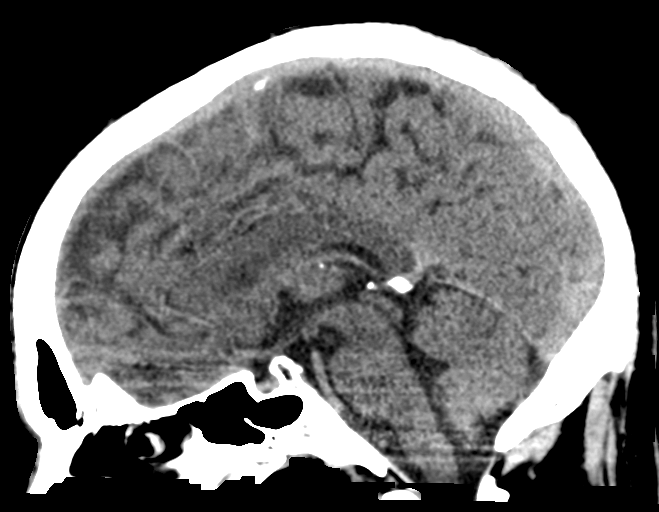
[im 39/58  brain]
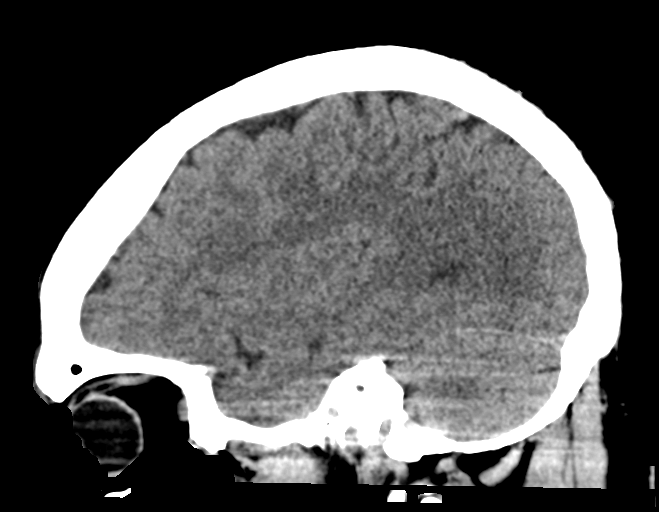

[16 of 47 positions shown; findings below may reference images not displayed]

FINDINGS: Brain: No evidence of acute infarction, hemorrhage, hydrocephalus,
extra-axial collection or mass lesion/mass effect.

Vascular: No hyperdense vessel or unexpected calcification.

Skull: Normal. Negative for fracture or focal lesion.

Sinuses/Orbits: No acute finding.

Other: None.
IMPRESSION: No acute intracranial abnormality.

## 2021-11-03 IMAGING — CT CT VENOGRAM HEAD
3 of 7 series · 17 of 47 positions shown · IV contrast (APPLIED)
Comparison: [DATE] CT head.

CLINICAL DATA: Dural venous sinus thrombus suspected

EXAM:
CT VENOGRAM HEAD
TECHNIQUE: Venographic phase images of the brain were obtained following the
administration of intravenous contrast. Multiplanar reformats and
maximum intensity projections were generated.

[Series 4: ax thin · axial · 0.39mm/px · z∈[-134,+8]mm · 11 of 165 slices shown]
[im 10/165  brain]
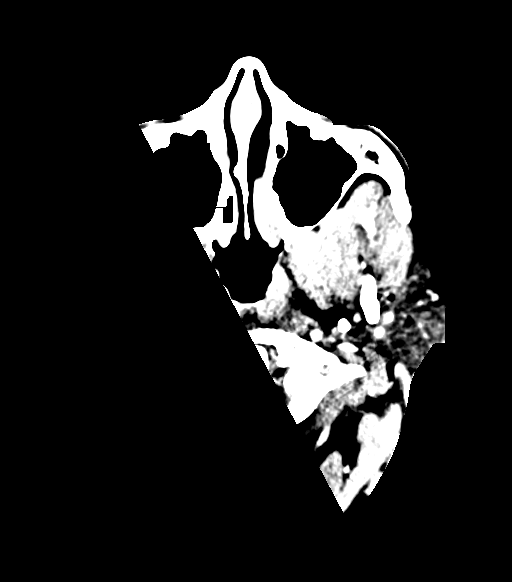
[im 29/165  bone]
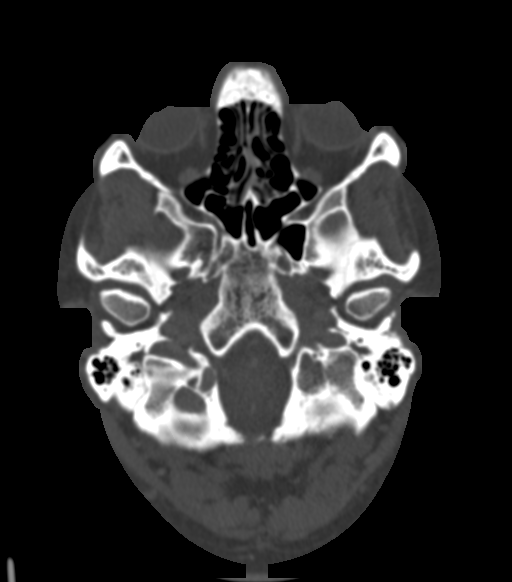
[im 39/165  brain]
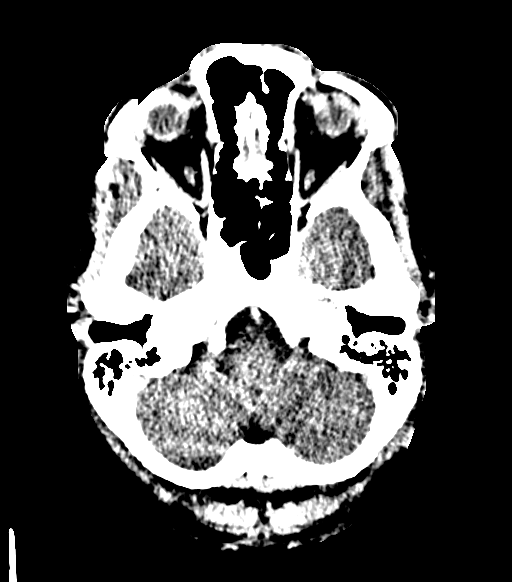
[im 58/165  bone]
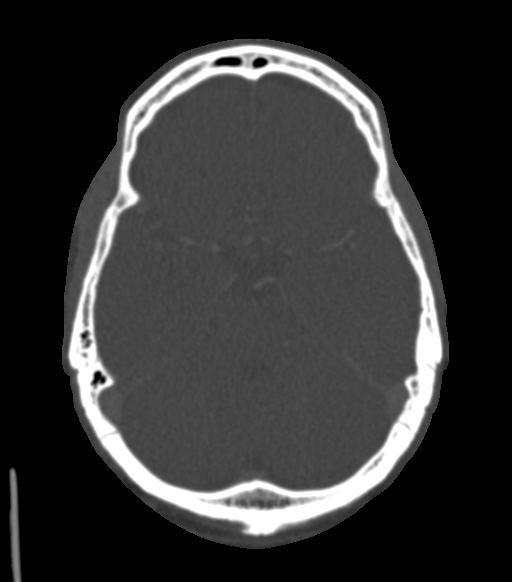
[im 68/165  brain]
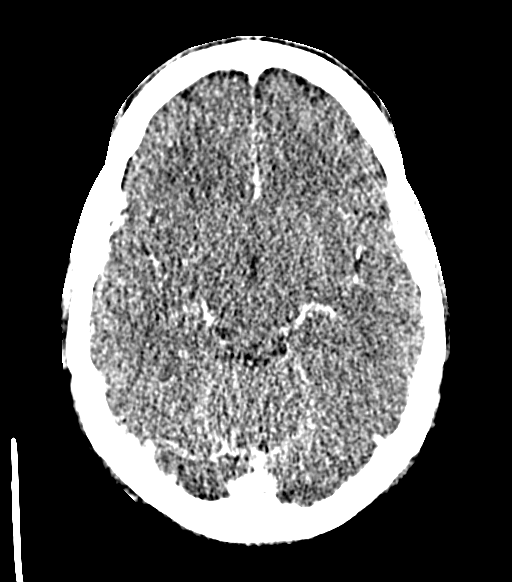
[im 87/165  bone]
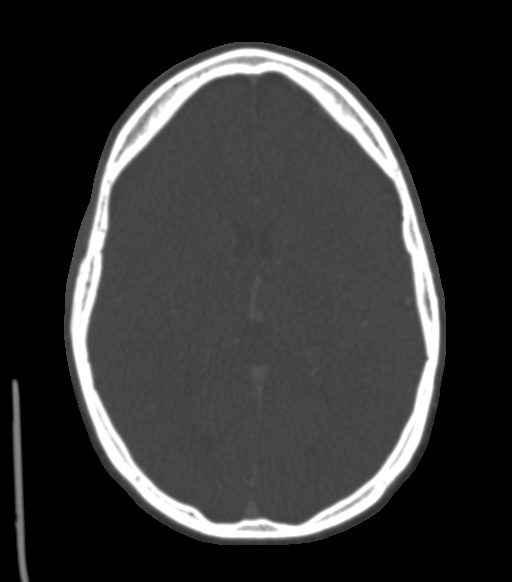
[im 97/165  brain]
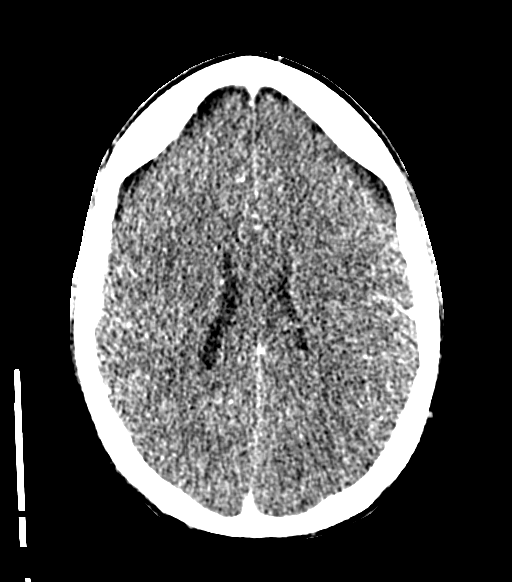
[im 107/165  bone]
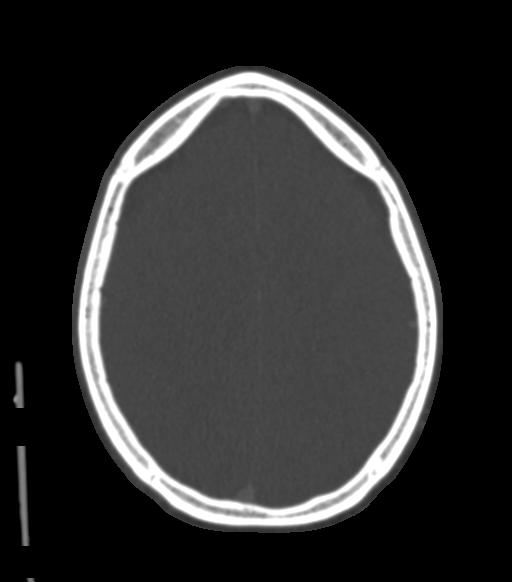
[im 126/165  brain]
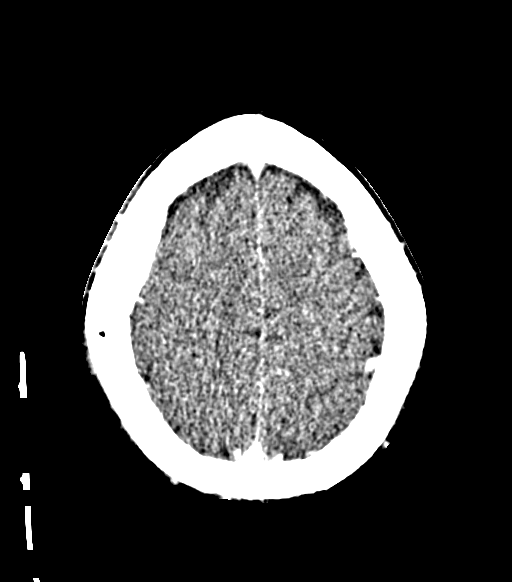
[im 136/165  bone]
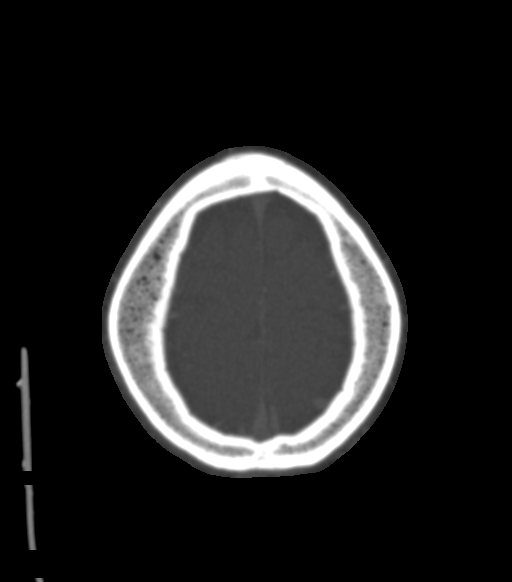
[im 155/165  brain]
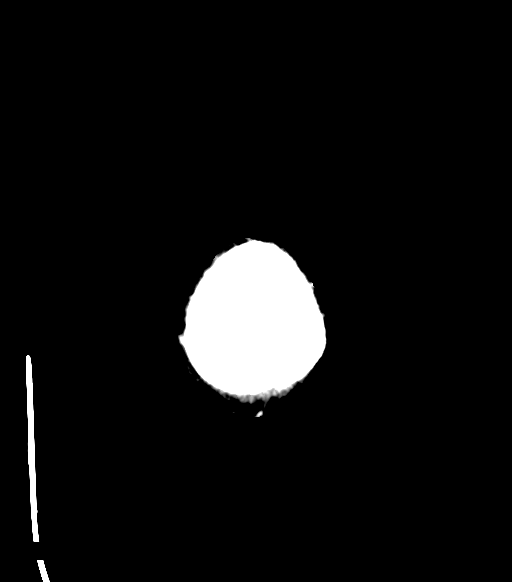

[Series 6: cor thin · coronal · 0.35mm/px · 3 of 212 slices shown]
[im 61/212  brain]
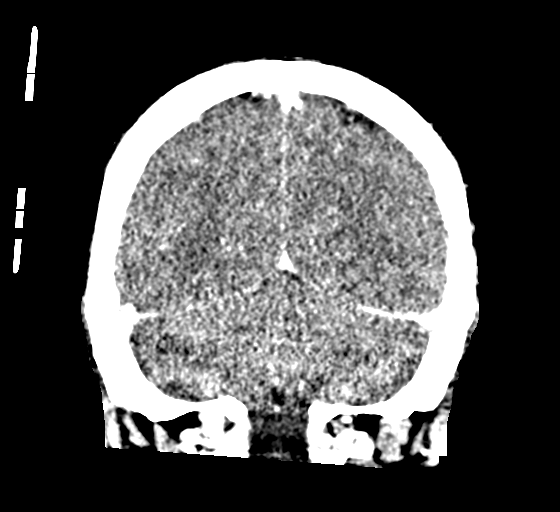
[im 91/212  brain]
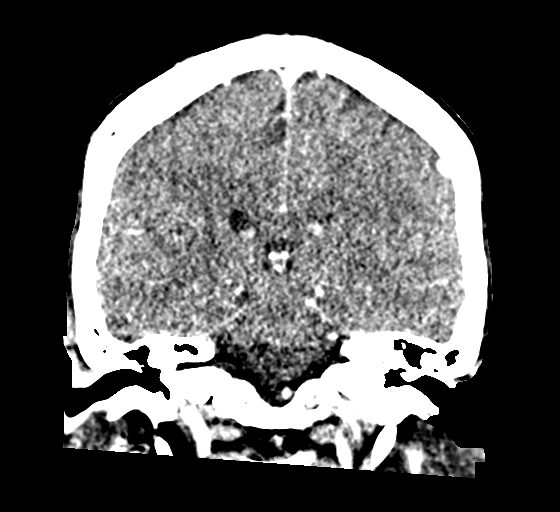
[im 121/212  brain]
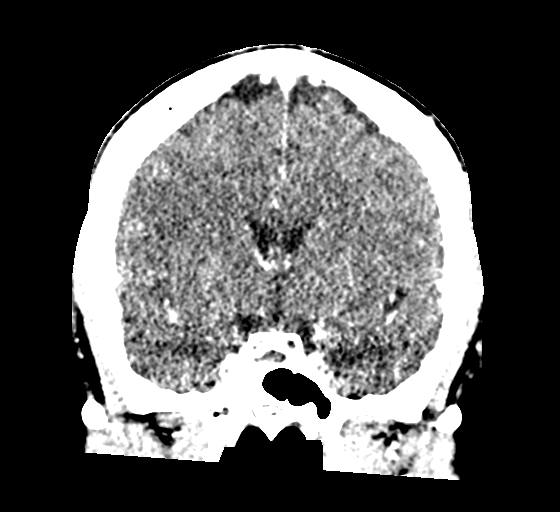

[Series 8: sag thin · sagittal · 0.34mm/px · 3 of 172 slices shown]
[im 35/172  brain]
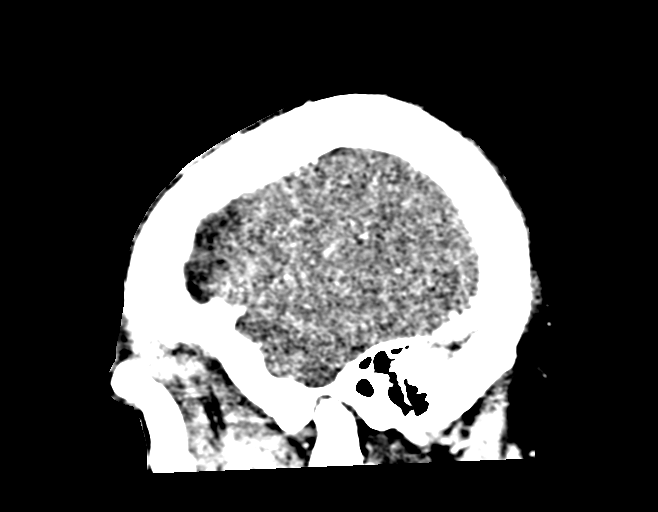
[im 69/172  brain]
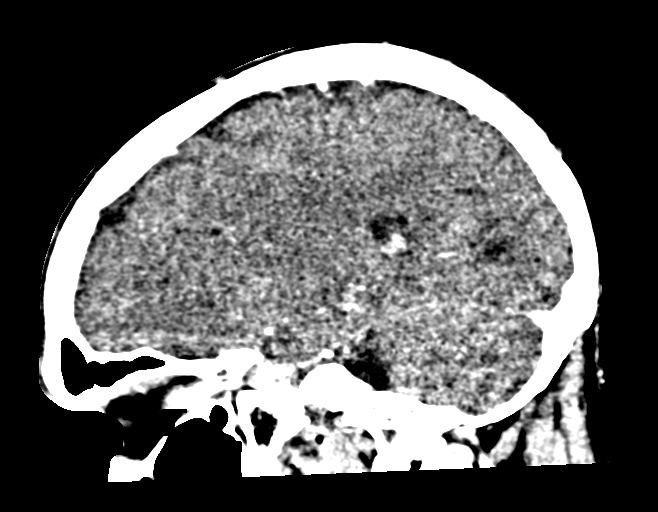
[im 103/172  brain]
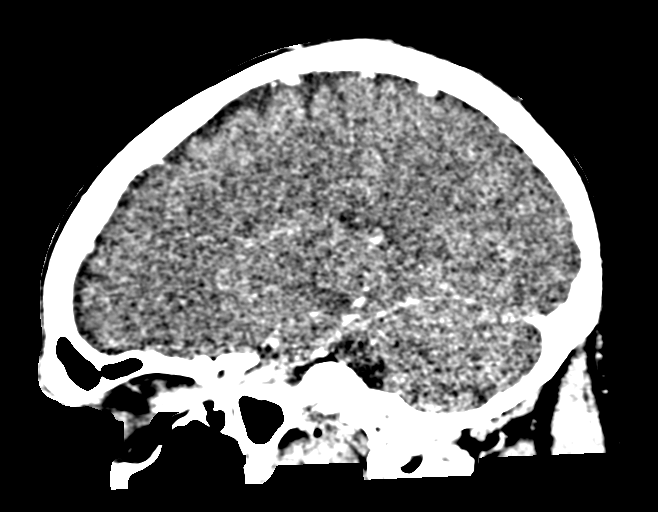

[17 of 47 positions shown; findings below may reference images not displayed]

RADIATION DOSE REDUCTION: This exam was performed according to the
departmental dose-optimization program which includes automated
exposure control, adjustment of the mA and/or kV according to
patient size and/or use of iterative reconstruction technique.

CONTRAST:  100mL OMNIPAQUE IOHEXOL 350 MG/ML SOLN
FINDINGS: Superior sagittal sinus: Normal.

Straight sinus: Normal.

Inferior sagittal sinus, vein of TIGER and internal cerebral veins:
Normal.

Transverse sinuses: Normal.

Sigmoid sinuses: Normal.

Visualized jugular veins: Normal
IMPRESSION: No evidence of venous sinus thrombosis or stenosis.

## 2021-11-03 MED ORDER — IOHEXOL 350 MG/ML SOLN
100.0000 mL | Freq: Once | INTRAVENOUS | Status: AC | PRN
  Administered 2021-11-03: 100 mL via INTRAVENOUS

## 2021-11-03 MED ORDER — ACETAMINOPHEN 500 MG PO TABS
1000.0000 mg | ORAL_TABLET | Freq: Once | ORAL | Status: AC
  Administered 2021-11-03: 1000 mg via ORAL
  Filled 2021-11-03: qty 2

## 2021-11-03 MED ORDER — SODIUM CHLORIDE 0.9 % IV BOLUS
1000.0000 mL | Freq: Once | INTRAVENOUS | Status: AC
  Administered 2021-11-03: 1000 mL via INTRAVENOUS

## 2021-11-03 MED ORDER — POTASSIUM CHLORIDE CRYS ER 20 MEQ PO TBCR
40.0000 meq | EXTENDED_RELEASE_TABLET | Freq: Once | ORAL | Status: AC
  Administered 2021-11-03: 40 meq via ORAL
  Filled 2021-11-03: qty 2

## 2021-11-03 MED ORDER — DIPHENHYDRAMINE HCL 50 MG/ML IJ SOLN
12.5000 mg | Freq: Once | INTRAMUSCULAR | Status: AC
  Administered 2021-11-03: 12.5 mg via INTRAVENOUS
  Filled 2021-11-03: qty 1

## 2021-11-03 MED ORDER — PROCHLORPERAZINE EDISYLATE 10 MG/2ML IJ SOLN
10.0000 mg | Freq: Once | INTRAMUSCULAR | Status: AC
  Administered 2021-11-03: 10 mg via INTRAVENOUS
  Filled 2021-11-03: qty 2

## 2021-11-03 NOTE — ED Notes (Signed)
Called Teleneurology for neuro consult for Seth Pigg, MD

## 2021-11-03 NOTE — ED Provider Notes (Addendum)
[facility] Provider Note    Event Date/Time   First MD Initiated Contact with Patient 11/03/21 2152     (approximate)   History   Post-op Problem and Nausea   HPI  Seth Calderon is a 31 y.o. male who is status post appendectomy who comes in with concerns for abdominal pain.  Patient comes in with 2 separate issues.  First issue is that he is reporting worsening abdominal pain, nausea, vomiting, Or discharge coming from his abdominal wound.  He does started following with Dr. Lysle Pearl who thought the wound was healing well.  However he is reporting worsening pain and nausea associated with it.  His other concern is that he has been having increased peripheral extremity weakness and unable to open water bottles or grab things.  He will poor trying to set down his coffee cup and will put it on its side.  He states that he was trying to walk up the stairs and he tripped due to some of this weakness.  Did not lose consciousness.  He states that his primary care doctor told him to come into the emergency room to be evaluated by a neurologist.  He reports that this has been gone ongoing for the couple weeks but is getting worse.  He is not seen a neurologist previously.  He does report some headaches that have been going on since the surgery.  They have been worsening over the past few weeks.  He reports at one point he had gotten a new oxygen concentrator which he uses for his long COVID and when he had put it on there was concern that it was not service straight and he was exposed to some inhalation and he was having worsening headaches after that.  However this was almost a month ago that this happened.   Physical Exam   Triage Vital Signs: ED Triage Vitals  Enc Vitals Group     BP 11/03/21 1944 (!) 149/97     Pulse Rate 11/03/21 1944 (!) 113     Resp 11/03/21 1944 18     Temp 11/03/21 1944 98.8 F (37.1 C)     Temp Source 11/03/21 1944 Oral     SpO2 11/03/21  1944 99 %     Weight 11/03/21 1945 253 lb (114.8 kg)     Height 11/03/21 1945 6\' 1"  (1.854 m)     Head Circumference --      Peak Flow --      Pain Score 11/03/21 1943 0     Pain Loc --      Pain Edu? --      Excl. in [place]? --     Most recent vital signs: Vitals:   11/03/21 1944  BP: (!) 149/97  Pulse: (!) 113  Resp: 18  Temp: 98.8 F (37.1 C)  SpO2: 99%     General: Awake, no distress.  CV:  Good peripheral perfusion.   Resp:  Normal effort.  Abd:  No distention.  Tender in the left lower quadrant.  Umbilical wound without any active drainage Other:  Equal strength in arms and legs sensation intact.  Finger-to-nose intact.  Cranial nerves appear normal Neck: Supple neck  ED Results / Procedures / Treatments   Labs (all labs ordered are listed, but only abnormal results are displayed) Labs Reviewed  BASIC METABOLIC PANEL - Abnormal; Notable for the following components:      Result Value   Sodium 134 (*)  Potassium 3.3 (*)    Chloride 97 (*)    Glucose, Bld 117 (*)    All other components within normal limits  CBC - Abnormal; Notable for the following components:   WBC 12.1 (*)    Platelets 443 (*)    All other components within normal limits  CULTURE, BLOOD (ROUTINE X 2)  CULTURE, BLOOD (ROUTINE X 2)  URINALYSIS, ROUTINE W REFLEX MICROSCOPIC  LACTIC ACID, PLASMA  LACTIC ACID, PLASMA  PROTIME-INR  APTT  HEPATIC FUNCTION PANEL  LIPASE, BLOOD     EKG  My interpretation of EKG: Normal sinus rate of 100, no ST elevation, no T wave inversions, normal intervals   RADIOLOGY I have reviewed the CT personally and agree with radiology read that it was negative   PROCEDURES:  .1-3 Lead EKG Interpretation Performed by: Vanessa Ventura, MD Authorized by: Vanessa Alford, MD     Interpretation: normal     ECG rate:  100   ECG rate assessment: normal     Rhythm: sinus rhythm     Ectopy: none     Conduction: normal     MEDICATIONS ORDERED IN  ED: Medications - No data to display   IMPRESSION / MDM / [place] / ED COURSE  I reviewed the triage vital signs and the nursing notes.  Patient with complex medical history including long COVID, recent appendectomy who now comes in with multiple concerns For the abdominal and back pain will get CT imaging to evaluate for any abscess, hematoma, kidney stone or other acute pathology.  Urine evaluate for UTI.  For the weakness and headaches patient had CT head from triage that was negative but will get CT venogram given these headaches having worsening since the surgery to make sure no evidence of venous clot.  We will try to consult neurology but not sure they will be added much overnight.  We will trial a migraine cocktail  CBC shows slightly elevated white count 12.1 BMP shows slightly low sodium at 3.3 CT head was negative Lipase normal  Patient handed off coming team pending these results I was able to also consult neurology  The patient is on the cardiac monitor to evaluate for evidence of arrhythmia and/or significant heart rate changes.   FINAL CLINICAL IMPRESSION(S) / ED DIAGNOSES   Final diagnoses:  Abdominal pain, unspecified abdominal location     Rx / DC Orders   ED Discharge Orders     None        Note:  This document was prepared using Dragon voice recognition software and may include unintentional dictation errors.   Vanessa Bath, MD 11/03/21 2229    Vanessa Grenville, MD 11/03/21 [phone]

## 2021-11-03 NOTE — Addendum Note (Signed)
Addended by: Jerl Mina on: 11/03/2021 02:05 PM   Modules accepted: Orders

## 2021-11-03 NOTE — ED Triage Notes (Addendum)
Pt was sent over from UC, 6 weeks ago pt had emergency appendectomy at [facility]. Pt has wound on his umbilicus from surgery due to hematoma. Pt states he fell Wednesday and his wound started to bleed, pt states he has also had "abnormal copper color discharge" from abd wound. Pt also states peripheral extremity weakness such as being unable to open water bottle or grab things. No slurred speech, facial droop or other symptoms. Pt ambulatory to triage room. Pt also endorses nausea

## 2021-11-03 NOTE — ED Provider Notes (Signed)
----------------------------------------- °  11:23 PM on 11/03/2021 -----------------------------------------  Assuming care from Dr. Jari Pigg.  In short, Seth Calderon is a 31 y.o. male with a a variety of complaints.  Refer to the original H&P for additional details.  The current plan of care is to follow-up on CT imaging of the abd/pelvis and venogram head. He is also speaking with neurology.   ----------------------------------------- 1:19 AM on 11/04/2021 -----------------------------------------  Patient is sleeping comfortably.  I spoke with the teleneurologist who agreed that the symptoms are unusual and very outpatient follow-up, but that the patient does not need inpatient treatment.  He also agreed that there is no indication for an emergent LP.  He recommended trial of ambulation, but the patient is not having consistent difficulties with ambulation, though he has had some falls at home.  I also reviewed the written note from the neurologist which is in [facility].  I followed up on the results of the CT scan of the abdomen and pelvis as well as a CT venogram, and I reviewed the images myself.  I was not able to identify any acute abnormalities.  I also reviewed the radiologists' reports of each study and they also were interpreted as normal studies, no evidence of venous sinus thrombosis nor stenosis on CT venogram, and no acute abnormalities status post appendectomy on CT abdomen/pelvis.  The patient's vital signs have stabilized with no persistent tachycardia.  He is in no distress.  I discussed all of the results with him and the neurology recommendations.  I will put in an ambulatory referral to neurology in [facility] as well as giving him the name of Dr. Manuella Ghazi in [place] to try and facilitate the follow-up process.  I gave my usual and customary return precautions.  The patient is at low risk for acute or life-threatening deterioration and that we considered admission, he is  appropriate for discharge and outpatient follow-up.  Final diagnoses:  Abdominal pain, unspecified abdominal location  Other polyneuropathy      Hinda Kehr, MD 11/04/21 [phone]

## 2021-11-03 NOTE — Consult Note (Signed)
[facility] TeleNeurology Consult Services  Stat Consult  Patient Name:   Seth Calderon, Seth Calderon Date of Birth:   Nov 12, 1990 Identification Number:   MRN - [id number] Date of Service:   11/03/2021 22:40:46  Diagnosis:       R53.1 - Weakness  Impression Patient presents for multiple symptoms to the ER somewhat which has been going on since August 2022 but worsened over the past 6 weeks or so. No clear lateralizing symptoms on exam. Differential includes medication effect versus toxic metabolic etiology. Peripheral nerve etiology is a possibility as well. I would recommend that he follow-up with a neurologist as an outpatient for an EMG/nerve conduction study to better assess that. CT head without contrast in the ER was unremarkable. Based on the chronicity of his symptoms as well as his unremarkable neuro exam, no acute need for LP at this point in time. Recommend ambulating him in the ER. If he feels stable on his feet then would recommend he follow-up with his PCP, psychiatrist, and establish with a neurologist for further neurological work-up as needed. However if he feels unstable on his feet in the ER then I would have a low threshold to admit him for observation and have physical therapy see him and neurology follow-up as an inpatient.  CT HEAD: As Per Radiologist CT Head Showed No Acute Hemorrhage or Acute Core Infarct  Our recommendations are outlined below.   Metrics: [facility] Notification Time: 11/03/2021 22:38:35 Stamp Time: 11/03/2021 22:40:46 Callback Response Time: 11/03/2021 22:41:08   ----------------------------------------------------------------------------------------------------  Chief Complaint: multiple sx  History of Present Illness: Patient is a 31 year old Male. 31 year old male with a history of hypertension, ADHD, autism spectrum disorder, anxiety, previous diagnosis of mood disorders, who does follow with a psychiatrist for  medication management, who presents to the hospital tonight for multiple symptoms. He reports that he has been feeling unwell since essentially August 2022. Has been having dexterity issues in his hands as well as some cognitive issues and memory problems. He reports that he had an appendectomy about 6 weeks ago that was complicated by postoperative hematoma healing complications. Since then he feels like his symptoms have gotten worse. He has been feeling unstable on his feet. Has been falling. Has had continued dexterity issues with his hands. Reports pain in his legs that was worse with the past 48 hours. Does report multiple changes in his medicines recently. Was on Wellbutrin but stopped it last week. Takes Klonopin as needed. Did have recent increases in his Lexapro however that seems to have been weaned back down. On exam I do not appreciate any clear lateralizing deficits.   Past Medical History:      Hypertension  Medications:  No Anticoagulant use  No Antiplatelet use Reviewed EMR for current medications  Allergies:  Reviewed  Social History: Drug Use: No  Family History:  There is no family history of premature cerebrovascular disease pertinent to this consultation  ROS : 14 Points Review of Systems was performed and was negative except mentioned in HPI.  Past Surgical History: There Is No Surgical History Contributory To Todays Visit   Examination: BP(149/97), Pulse(113), Blood Glucose(/) 1A: Level of Consciousness - Alert; keenly responsive + 0 1B: Ask Month and Age - Both Questions Right + 0 1C: Blink Eyes & Squeeze Hands - Performs Both Tasks + 0 2: Test Horizontal Extraocular Movements - Normal + 0 3: Test Visual Fields - No Visual Loss + 0 4: Test Facial Palsy (Use Grimace if Obtunded) -  Normal symmetry + 0 5A: Test Left Arm Motor Drift - No Drift for 10 Seconds + 0 5B: Test Right Arm Motor Drift - No Drift for 10 Seconds + 0 6A: Test Left Leg Motor Drift - No  Drift for 5 Seconds + 0 6B: Test Right Leg Motor Drift - No Drift for 5 Seconds + 0 7: Test Limb Ataxia (FNF/Heel-Shin) - No Ataxia + 0 8: Test Sensation - Normal; No sensory loss + 0 9: Test Language/Aphasia - Normal; No aphasia + 0 10: Test Dysarthria - Normal + 0 11: Test Extinction/Inattention - No abnormality + 0  NIHSS Score: 0     Patient / Family was informed the Neurology Consult would occur via TeleHealth consult by way of interactive audio and video telecommunications and consented to receiving care in this manner.  Patient is being evaluated for possible acute neurologic impairment and high probability of imminent or life - threatening deterioration.I spent total of 35 minutes providing care to this patient, including time for face to face visit via telemedicine, review of medical records, imaging studies and discussion of findings with providers, the patient and / or family.   Dr Knox Royalty   [facility] [phone]  Case [id number]
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
## 2021-11-04 LAB — RESP PANEL BY RT-PCR (FLU A&B, COVID) ARPGX2
Influenza A by PCR: NEGATIVE
Influenza B by PCR: NEGATIVE
SARS Coronavirus 2 by RT PCR: NEGATIVE

## 2021-11-04 LAB — LACTIC ACID, PLASMA: Lactic Acid, Venous: 0.7 mmol/L (ref 0.5–1.9)

## 2021-11-04 NOTE — Discharge Instructions (Signed)
As we discussed, your work-up was generally reassuring.  The neurologist evaluated you and does not have any specific answers but agrees that inpatient treatment would not be beneficial, but that you likely would benefit from outpatient follow-up.  We provided a referral to neurology in [place] and someone should reach out to you to try to schedule a follow-up appointment.  We also provided the phone number for Dr. Jennings Books, one of the neurologist in [place].  You can call the number provided and see about scheduling the next available follow-up appointment.  Please continue to take your regular medications.  Please try to avoid potentially dangerous situations given that you have been having some recent falls.  Follow-up with your regular doctor as well.  Return to the emergency department if you develop new or worsening symptoms that concern you.

## 2021-11-04 NOTE — ED Notes (Signed)
Pt ambulated with a steady gait in hallway.

## 2021-11-05 ENCOUNTER — Other Ambulatory Visit: Payer: Self-pay | Admitting: Surgery

## 2021-11-05 DIAGNOSIS — R1084 Generalized abdominal pain: Secondary | ICD-10-CM

## 2021-11-06 ENCOUNTER — Other Ambulatory Visit: Payer: Self-pay | Admitting: Otolaryngology

## 2021-11-06 DIAGNOSIS — E328 Other diseases of thymus: Secondary | ICD-10-CM

## 2021-11-10 ENCOUNTER — Other Ambulatory Visit: Payer: Self-pay

## 2021-11-10 ENCOUNTER — Ambulatory Visit: Payer: BC Managed Care – PPO | Attending: Family Medicine | Admitting: Physical Therapy

## 2021-11-10 DIAGNOSIS — M25561 Pain in right knee: Secondary | ICD-10-CM | POA: Diagnosis present

## 2021-11-10 DIAGNOSIS — M62838 Other muscle spasm: Secondary | ICD-10-CM | POA: Diagnosis present

## 2021-11-10 DIAGNOSIS — R293 Abnormal posture: Secondary | ICD-10-CM | POA: Insufficient documentation

## 2021-11-10 DIAGNOSIS — R2689 Other abnormalities of gait and mobility: Secondary | ICD-10-CM | POA: Diagnosis not present

## 2021-11-10 DIAGNOSIS — G8929 Other chronic pain: Secondary | ICD-10-CM | POA: Insufficient documentation

## 2021-11-10 NOTE — Patient Instructions (Signed)
Open book ( mobilize thoracic spine and intercostals ) handout   Then perform deep core ( handout)  Notice not holding pause too long.  Expand at diaphragm and not push with belly   Set up pillows on incline ( vertical pillow to horizontal pillow) placed along entire back to help with breathing   __  Noticed points of contact, gentle breathing when notice pain  Practice breathing for urination

## 2021-11-10 NOTE — Therapy (Addendum)
[place] MAIN [facility] SERVICES [address] [place], [place], [address] Phone: [phone]   Fax:  [phone]  Physical Therapy Treatment  Patient Details  Name: Seth Calderon MRN: [id number] Date of Birth: 1991/09/22 Referring Provider (PT): Hendricks Milo FNP   Encounter Date: 11/10/2021   PT End of Session - 11/10/21 1002     Visit Number 2    Number of Visits 10    Date for PT Re-Evaluation 01/11/22    PT Start Time 1962    PT Stop Time 1002    PT Time Calculation (min) 67 min             Past Medical History:  Diagnosis Date   Hypertension     Past Surgical History:  Procedure Laterality Date   APPENDECTOMY      There were no vitals filed for this visit.   Subjective Assessment - 11/10/21 0855     Subjective Pt went to the ED on 11/03/21 for neurological Sx ( weakness in both arms/ legs that has worsened since his surgery in Jan). Pt had a fall the previous week and had a fall that same day.  There was no neurologist there at the ER dept and they gave him a referral to neurology but no one has called him back yet.  Pt also reports new LBP located below the rib cage on his back on both sides ( initially worst at R but now B) and this started over the weekend. Pt  has a referral to GI. On Thursday and Friday last week, pt had persistant diarrhea. No BM on Sat/Sun. Pt has had no appetite and has been vomitting.    Pertinent History Pt experienced PTSD after complications from appendectomy. Pt feels he is at the same cognitive level as he was pre surgery. Pt's lifting restrictions will be removed in 11/05/21.      Long COVID fatigue / brain fog:  pt has been on supplemental oxygen at night since Sept 2022. Pt has a sleep study scheduled.  B swelling/ weakness in wrists and hands that started in October 2022.     Pt had inhalation exposure on 10/09/21 when using another supplemental oxygen device. There was an unknown white powder  that was all inside the machine. Pt felt burning in his lungs, had 72-hour long HA. Pt noticed the next day after inhaling this powder, pt dropped items and could not hold thigns in his hands due to weakness. Pt had difficulty with fine motor skills and depth perception. Pt had noticed more falls, vertigo, dizziness, weakness in his legs after exposure to the white powder. Pt has not had the white powder tested. Pt is on short-term disability.    Patient Stated Goals get back to preCOVID activity level ( daily exerise, rowing, walking, golfing)                [facility] PT Assessment - 11/10/21 1840       Observation/Other Assessments   Observations semi reclined position helped pt to breathe better for deep core training.      Coordination   Coordination and Movement Description breathholding with report of being out of breath , ab straining,      Palpation   Spinal mobility hypomobile thoracic region , limited excursion of diaphragm                           [facility] Adult PT  Treatment/Exercise - 11/10/21 7425       Therapeutic Activites    Other Therapeutic Activities pain science education,  breathing with urination , keep food diary, called Dr. Raul Del office to expedite an appt with him, explained how to change positions and stability propicoeption to minimize pain      Neuro Re-ed    Neuro Re-ed Details  cued for deep core , position strategies for better breathing, relief of lumbar      Exercises   Exercises Other Exercises    Other Exercises  see pt instructions ( open book ( thoracic mobility) to be donw prior to deep core ,                          PT Long Term Goals - 11/03/21 1142       PT LONG TERM GOAL #1   Title FOTO scores will decrease by <5 pt change in order to improve QOL, ADLs: Pain 46 pts, Urinary PFDI 17 pts, PFDI Bowel 38 pts    Time 10    Period Weeks    Status New    Target Date 01/12/22      PT LONG TERM GOAL #2    Title Pt will demo no separation below umbilicus in order to improve IAP system to minimize GI Sx, nausea and improve urination / bowel movments    Baseline 3 fingers width    Time 8    Period Weeks    Status New    Target Date 12/15/21      PT LONG TERM GOAL #3   Title Pt will demo equal alignment of pelvic girdle, levelled shoulder,no convex thoracic curve in order to improve pain, IAP system to learn deep core HEP    Time 4    Period Weeks    Status New    Target Date 12/01/21      PT LONG TERM GOAL #4   Title Pt will report no straining required to urination and no burning with urination    Time 6    Period Weeks    Status New    Target Date 12/15/21      PT LONG TERM GOAL #5   Title Pt will report decreased pain at R knee by 50% with stairs, bending, and when seated    Time 8    Status New    Target Date 12/29/21      PT LONG TERM GOAL #6   Title Pt will demo IND and correct technique with deep core level 1-2 to decrease back and suprapubic pain and have more pelvic/spinal stability to return to rowing and exercises    Time 4    Status New    Target Date 12/01/21                   Plan - 11/10/21 1120     Clinical Impression Statement Today is pt's 2nd visit to PT. Pt reported he had a visit to ED for the weakness in his limbs and falls which have worsened since his appendectomy and complications post surgery. Pt also reports new LBP located below the rib cage on his back on both sides ( initially worst at R but now B) and this started over the weekend. Pt  has a referral to GI. On Thursday and Friday last week, pt had persistant diarrhea. No BM on Sat/Sun. Pt has had no appetite and has been vomitting.  Pt was referred to neurologist at the ED but he had not heard back from office. Therapist called Dr. Trena Platt office and was able to expendite his appt to tomorrow at 9:30am for a work-up.   Initiated deep core training today but pt required modifications to semi  reclined position to enable better breathing and less back pain. Back pain decreased from 6-710 to 2-3/10 after Tx.   Pt was explained about slowed diaphragmatic breathing is helpful for pain management, spinal strengthening, and GI motility. Provided education on pain science and the role of downregulating nn system can be helpful given pt reporting PTSD from his appendectomy and s/p complications. Provided education on using breathing technique to relax pelvic floor to help with urination to address his pelvic Sx.    Withholding manual Tx until pt is cleared from his medical work up with  neurologist. Plan to modify POC based his test results. Plan to communicate with interdisciplinary team.  Pt has also been referred to GI for his vomiting/ diarrhea Sx.   Continue with education and therapeutic exercise / neuro-education. Pt continues to  benefit from skilled PT.      Personal Factors and Comorbidities Fitness;Past/Current Experience;Other    Examination-Activity Limitations Continence;Stairs;Toileting;Sit;Lift    Stability/Clinical Decision Making Evolving/Moderate complexity    Rehab Potential Good    PT Frequency 1x / week    PT Duration Other (comment)   10   PT Treatment/Interventions Functional mobility training;Therapeutic activities;Therapeutic exercise;Stair training;Neuromuscular re-education;Manual techniques;Patient/family education;Joint Manipulations;Taping;Manual lymph drainage;Passive range of motion;Gait training;Scar mobilization;Canalith Repostioning;Cryotherapy;Traction;ADLs/Self Care Home Management;Balance training    Consulted and Agree with Plan of Care Patient             Patient will benefit from skilled therapeutic intervention in order to improve the following deficits and impairments:  Decreased activity tolerance, Decreased endurance, Hypomobility, Decreased strength, Decreased range of motion, Decreased balance, Decreased coordination, Decreased mobility,  Increased muscle spasms, Abnormal gait, Improper body mechanics, Pain, Postural dysfunction, Difficulty walking, Decreased scar mobility, Impaired flexibility, Impaired sensation  Visit Diagnosis: Other abnormalities of gait and mobility  Abnormal posture  Other muscle spasm  Chronic pain of right knee     Problem List There are no problems to display for this patient.   Jerl Mina, PT 11/10/2021, 6:46 PM  [place] MAIN [facility] SERVICES [address] [place], [place], [address] Phone: [phone]   Fax:  [phone]  Name: Seth Calderon MRN: [id number] Date of Birth: 1991-02-14

## 2021-11-16 ENCOUNTER — Other Ambulatory Visit: Payer: Self-pay

## 2021-11-16 ENCOUNTER — Ambulatory Visit: Payer: BC Managed Care – PPO | Admitting: Physical Therapy

## 2021-11-16 DIAGNOSIS — M62838 Other muscle spasm: Secondary | ICD-10-CM

## 2021-11-16 DIAGNOSIS — R2689 Other abnormalities of gait and mobility: Secondary | ICD-10-CM | POA: Diagnosis not present

## 2021-11-16 DIAGNOSIS — R293 Abnormal posture: Secondary | ICD-10-CM

## 2021-11-16 DIAGNOSIS — G8929 Other chronic pain: Secondary | ICD-10-CM

## 2021-11-16 NOTE — Therapy (Signed)
[place] MAIN [facility] SERVICES [address] [place], [place], [address] Phone: [phone]   Fax:  [phone]  Physical Therapy Treatment  Patient Details  Name: Seth Calderon MRN: [id number] Date of Birth: [birth year] Referring Provider (PT): Hendricks Milo FNP   Encounter Date: 11/16/2021   PT End of Session - 11/16/21 1104     Visit Number 3    Number of Visits 10    Date for PT Re-Evaluation 01/11/22    PT Start Time 1004    PT Stop Time 1104    PT Time Calculation (min) 60 min             Past Medical History:  Diagnosis Date   Hypertension     Past Surgical History:  Procedure Laterality Date   APPENDECTOMY      There were no vitals filed for this visit.   Subjective Assessment - 11/16/21 1009     Subjective Pt reported his nausea is constant but no vomitting but he feels it up in the throat but it does not produce anything. Pt also does not have appetite during the day  . For a mood standpoint, this weekend has been terrible but he has upcoming appts with psychotherapist and psychiatrist. From a Sx standpoint, it has been bad. Since Friday, pt feels everything is spiraling. Bowel movements improved last Tuesday and were good and healthy BMS through Friday. This weekend , there was no more diarrhea but also there more constipation with cramping and urgency. Pt has been tracking his food.  Pt has been making a protein shake in the morning for the past 6-8 months. Pt does not have any solids foods until 12:30pm, most of the time until 2pm. Pt has an aversion to food. He can tolerate the shakes through small sips throughout several hours. Pt saw Dr. Manuella Ghazi , neurologist who has ordered many tests. Pt 's sleep study is this Wednesday overnight at the clinic.  Pt did not wear his oxygen last night and noticed he had more productive phlegm when he woke up. Pt will be seeing his pulmonogist this week.    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 Pt feels he is at the same cognitive level as he was pre surgery. Pt's lifting restrictions will be removed in 11/05/21.      Long COVID fatigue / brain fog:  pt has been on supplemental oxygen at night since Sept 2022. Pt has a sleep study scheduled.  B swelling/ weakness in wrists and hands that started in October 2022.     Pt had inhalation exposure on 10/09/21 when using another supplemental oxygen device. There was an unknown white powder that was all inside the machine. Pt felt burning in his lungs, had 72-hour long HA. Pt noticed the next day after inhaling this powder, pt dropped items and could not hold thigns in his hands due to weakness. Pt had difficulty with fine motor skills and depth perception. Pt had noticed more falls, vertigo, dizziness, weakness in his legs after exposure to the white powder. Pt has not had the white powder tested. Pt is on short-term disability.    Patient Stated Goals get back to preCOVID activity level ( daily exerise, rowing, walking, golfing)                [facility] PT Assessment - 11/16/21 1022       Coordination   Coordination and Movement Description improved coordination, no  Sx today, pt placed in semi reclined position      Strength   Overall Strength Comments L hip abduction 3/5, R hip abd 4+/5                           [facility] Adult PT Treatment/Exercise - 11/16/21 1054       Therapeutic Activites    Other Therapeutic Activities medical updates, active listening      Neuro Re-ed    Neuro Re-ed Details  cued for new HEP technique      Exercises   Other Exercises  see pt instructions ( open book ( thoracic mobility) to be donw prior to deep core ,                          PT Long Term Goals - 11/03/21 1142       PT LONG TERM GOAL #1   Title FOTO scores will decrease by <5 pt change in order to improve QOL, ADLs: Pain 46 pts, Urinary PFDI 17 pts, PFDI  Bowel 38 pts    Time 10    Period Weeks    Status New    Target Date 01/12/22      PT LONG TERM GOAL #2   Title Pt will demo no separation below umbilicus in order to improve IAP system to minimize GI Sx, nausea and improve urination / bowel movments    Baseline 3 fingers width    Time 8    Period Weeks    Status New    Target Date 12/15/21      PT LONG TERM GOAL #3   Title Pt will demo equal alignment of pelvic girdle, levelled shoulder,no convex thoracic curve in order to improve pain, IAP system to learn deep core HEP    Time 4    Period Weeks    Status New    Target Date 12/01/21      PT LONG TERM GOAL #4   Title Pt will report no straining required to urination and no burning with urination    Time 6    Period Weeks    Status New    Target Date 12/15/21      PT LONG TERM GOAL #5   Title Pt will report decreased pain at R knee by 50% with stairs, bending, and when seated    Time 8    Status New    Target Date 12/29/21      PT LONG TERM GOAL #6   Title Pt will demo IND and correct technique with deep core level 1-2 to decrease back and suprapubic pain and have more pelvic/spinal stability to return to rowing and exercises    Time 4    Status New    Target Date 12/01/21                   Plan - 11/16/21 1105     Clinical Impression Statement Pt demo'd good carry over with deep core HEP without needing cues for correction. He was provided more therapeutic exercises to promote lengthening of erector spinae which helped with his c/o mm tightness at this location. Pt was prescribed exercises for scapular stabilization and oblique strengthening. Resistance bands were provided with green band.  All exercises were performed in semi reclined position. Educated pt on returning to recumbent biking but only for 10 min without resistance with stretches afterwards  and 6 min walking at separate times. Pt understands he tends to want to get back to heavy lifting immediately.  Pt explained the slow gradual strengthening with deep core and today's band exercise with trunk stability that must be in place first before returning to heavy lifting.   Pt had minimal complaints with breathing today. Pt demo'd IND with new HEP.   Pt continues to benefit from skilled PT  Plan to continue to withhold manual Tx until his medial workup results have returned. Plan to communicate with his doctors and initiate virtual interdisciplinary team meeting as needed or group email. Pt sees his psychotherapist, psychiatrist, pulmonologist this week.   Pt has been referred to GI as well by ER doctor form from week. This past week, pt no longer has diarrhea but has had Type 1-2 stool type with constipation. Pt reported his nausea is constant but no vomitting but he feels it up in the throat but it does not produce anything. Pt also does not have appetite during the day  . Pt has been tracking his food.  Pt has been making a protein shake in the morning for the past 6-8 months. Pt does not have any solids foods until 12:30pm, most of the time until 2pm. Pt has an aversion to food. He can tolerate the shakes through small sips throughout several hours.  Pt continues to benefit from skilled PT    Personal Factors and Comorbidities Fitness;Past/Current Experience;Other    Examination-Activity Limitations Continence;Stairs;Toileting;Sit;Lift    Stability/Clinical Decision Making Evolving/Moderate complexity    Clinical Decision Making Moderate    Rehab Potential Good    PT Frequency 1x / week    PT Duration Other (comment)   10   PT Treatment/Interventions Functional mobility training;Therapeutic activities;Therapeutic exercise;Stair training;Neuromuscular re-education;Manual techniques;Patient/family education;Joint Manipulations;Taping;Manual lymph drainage;Passive range of motion;Gait training;Scar mobilization;Canalith Repostioning;Cryotherapy;Traction;ADLs/Self Care Home Management;Balance  training    Consulted and Agree with Plan of Care Patient             Patient will benefit from skilled therapeutic intervention in order to improve the following deficits and impairments:  Decreased activity tolerance, Decreased endurance, Hypomobility, Decreased strength, Decreased range of motion, Decreased balance, Decreased coordination, Decreased mobility, Increased muscle spasms, Abnormal gait, Improper body mechanics, Pain, Postural dysfunction, Difficulty walking, Decreased scar mobility, Impaired flexibility, Impaired sensation  Visit Diagnosis: Other abnormalities of gait and mobility  Abnormal posture  Chronic pain of right knee  Other muscle spasm     Problem List There are no problems to display for this patient.   Jerl Mina, PT 11/16/2021, 11:23 AM  [place] MAIN [facility] SERVICES [address] [place], [place], [address] Phone: [phone]   Fax:  [phone]  Name: Makael Stein MRN: [id number] Date of Birth: May 18, 1991

## 2021-11-16 NOTE — Patient Instructions (Addendum)
Inclined position:  Knee to chest with towel under thigh To loosen erector spinae   5 breaths   ___   Clam Shell  To strengthen the R hip    Lying with hips and knees bent 45, one pillow between knees and ankles. Heel together, toes apart like ballerina,  Lift knee with exhale while pressing heels together. Be sure pelvis does not roll backward. Do not arch back. Do 20 times, only on the L,   2 times per day.    Complimentary stretch: Figure-4 ( 45 deg ) Both legs  3 breaths     ___  Lying on back, knees bent    band under ballmounds  while laying on back w/ knees bent  "W" exercise  10 reps x 2 sets   Band is placed under feet, knees bent, feet are hip width apart Hold band with thumbs point out, keep upper arm and elbow touching the bed the whole time  - inhale and then exhale pull bands by bending elbows hands move in a "w"  (feel shoulder blades squeezing)   _______________  Oblique/ scapula stabilization   Opposite arm   Place band in "U"    band under ballmounds  while laying on back w/ knees bent     20 reps  on each side  Holding band from opposite thigh,  Inhale,    exhale then pull band across body while keeping elbow , shoulders, back of the head pressed down    ______________

## 2021-11-19 ENCOUNTER — Ambulatory Visit
Admission: RE | Admit: 2021-11-19 | Discharge: 2021-11-19 | Disposition: A | Discharge status: Home / Self Care | Payer: BC Managed Care – PPO | Source: Ambulatory Visit | Attending: Otolaryngology | Admitting: Otolaryngology

## 2021-11-19 ENCOUNTER — Other Ambulatory Visit: Payer: Self-pay | Admitting: Otolaryngology

## 2021-11-19 DIAGNOSIS — R519 Headache, unspecified: Secondary | ICD-10-CM

## 2021-11-19 DIAGNOSIS — E569 Vitamin deficiency, unspecified: Secondary | ICD-10-CM

## 2021-11-19 DIAGNOSIS — E328 Other diseases of thymus: Secondary | ICD-10-CM

## 2021-11-19 IMAGING — MR MR NECK SOFT TISSUE ONLY WO/W CM
4 of 10 series · 19 of 48 positions shown · IV contrast (20 mL Multihance)
Comparison: Neck CT [DATE]

CLINICAL DATA: Throat pain with difficulty swallowing.

EXAM:
MRI OF THE NECK WITH CONTRAST
TECHNIQUE: Multiplanar, multisequence MR imaging was performed following the
administration of intravenous contrast.
CONTRAST:  20mL MULTIHANCE GADOBENATE DIMEGLUMINE 529 MG/ML IV SOLN

[Series 3: T1 · sagittal · 5.0mm · 0.45mm/px · 5 of 25 slices shown]
[im 1/25]
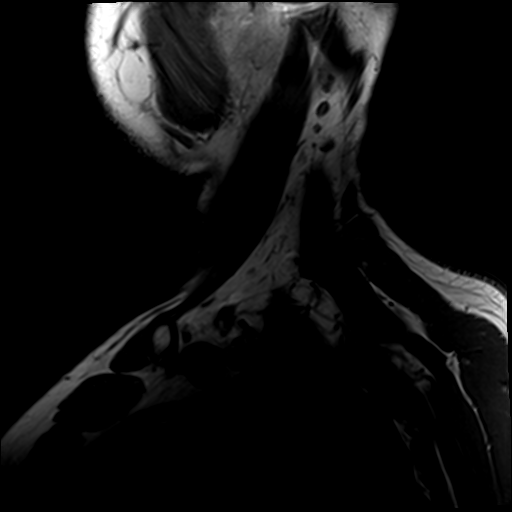
[im 7/25]
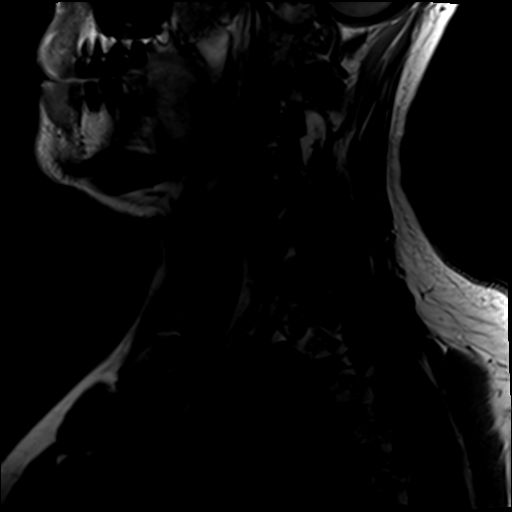
[im 13/25]
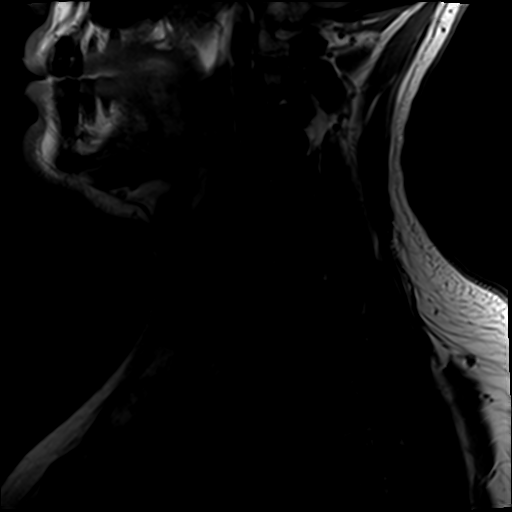
[im 19/25]
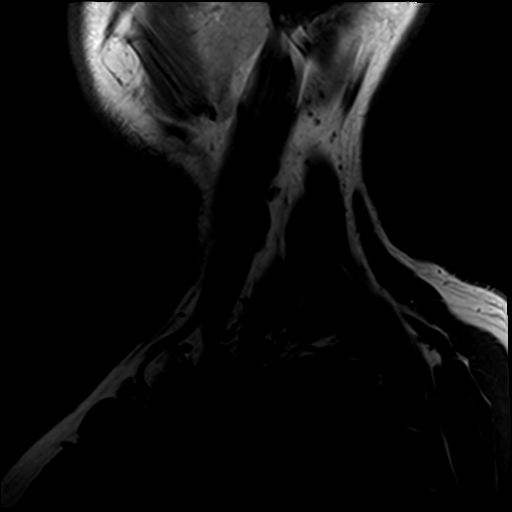
[im 25/25]
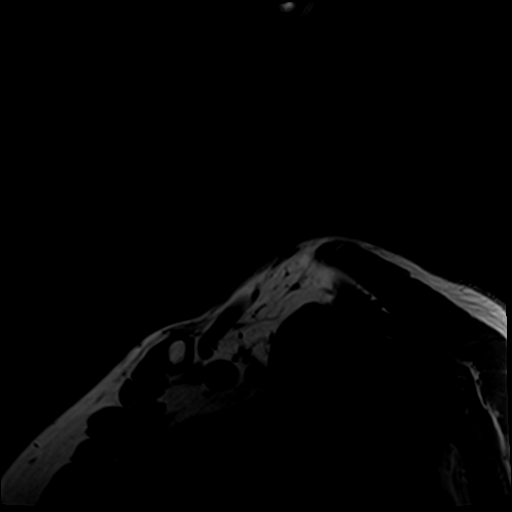

[Series 4: T2 fat-sat · sagittal · 5.0mm · 0.45mm/px · 4 of 25 slices shown (1 of 3)]
[im 1/25]
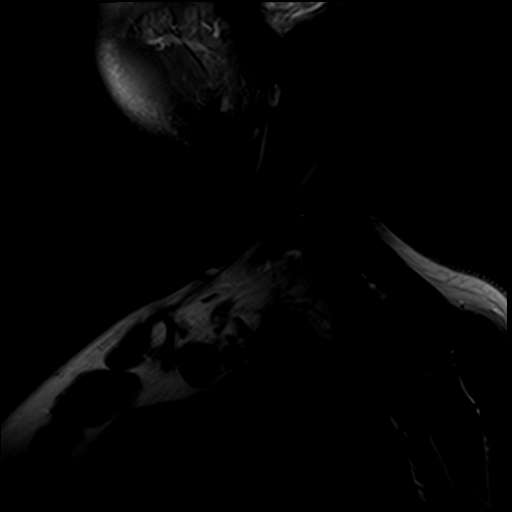
[im 9/25]
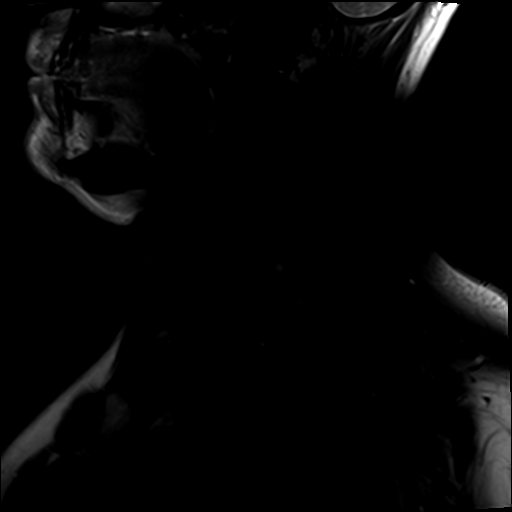
[im 17/25]
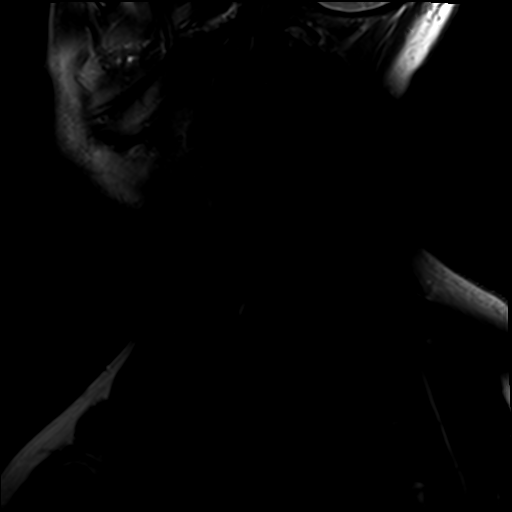
[im 25/25]
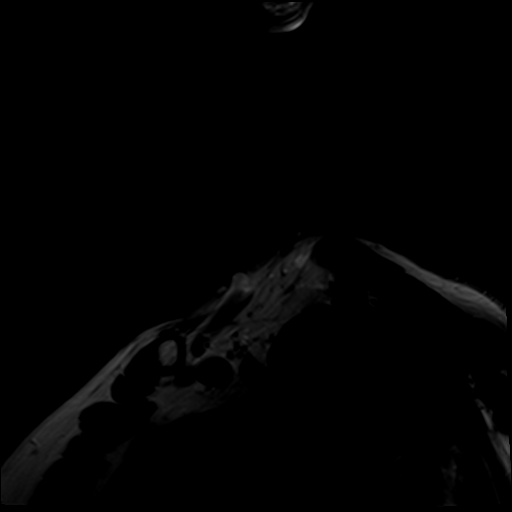

[Series 6: T2 fat-sat · coronal · 4.0mm · 0.45mm/px · 5 of 31 slices shown (2 of 3)]
[im 1/31]
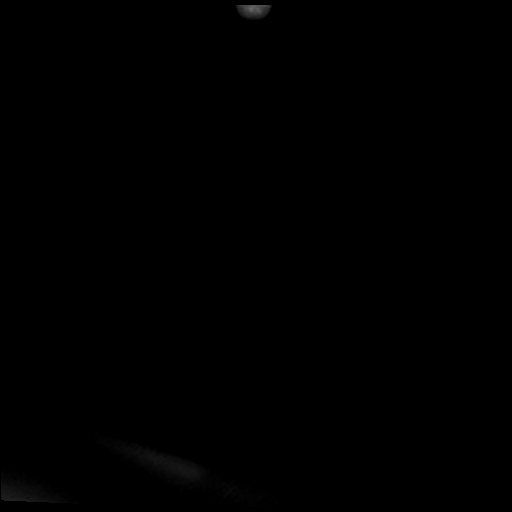
[im 8/31]
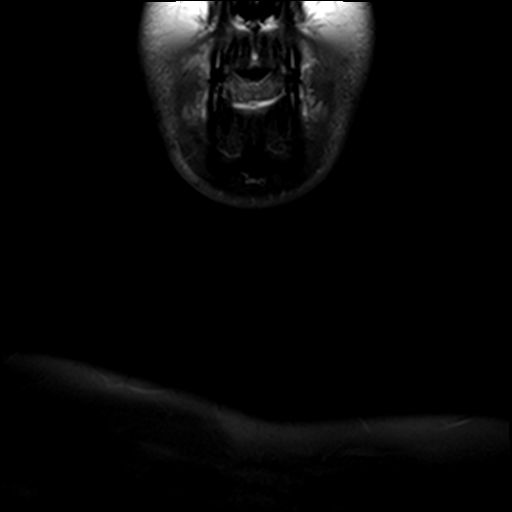
[im 16/31]
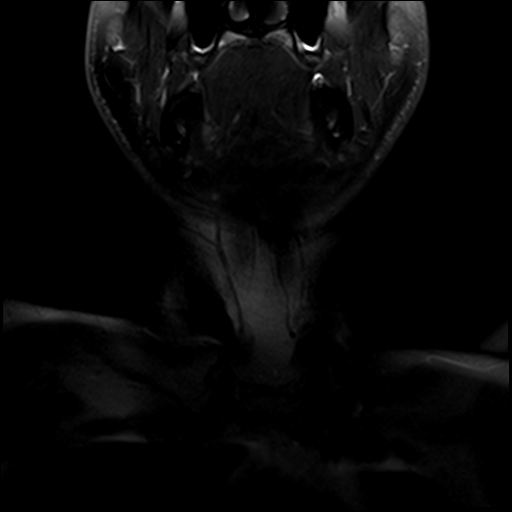
[im 23/31]
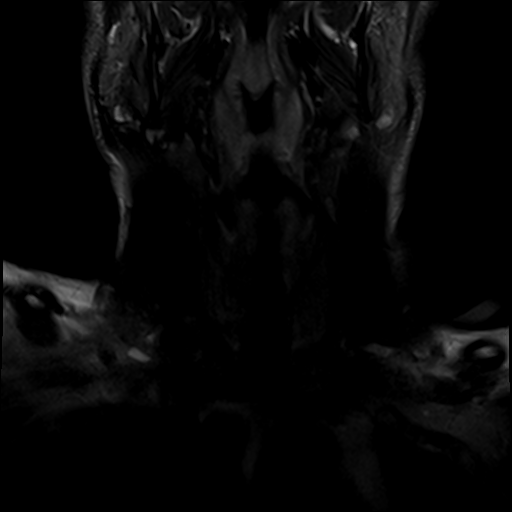
[im 31/31]
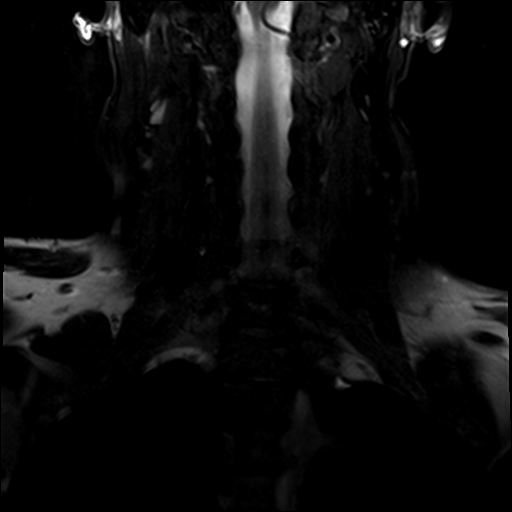

[Series 8: T2 fat-sat · axial · 5.0mm · 0.43mm/px · z∈[-55,+132]mm · 5 of 30 slices shown (3 of 3)]
[im 1/30]
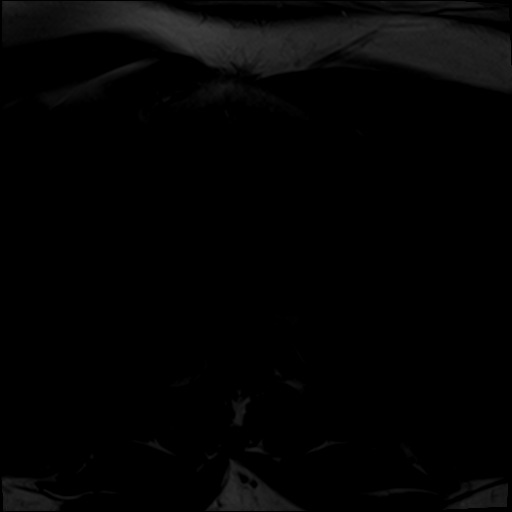
[im 8/30]
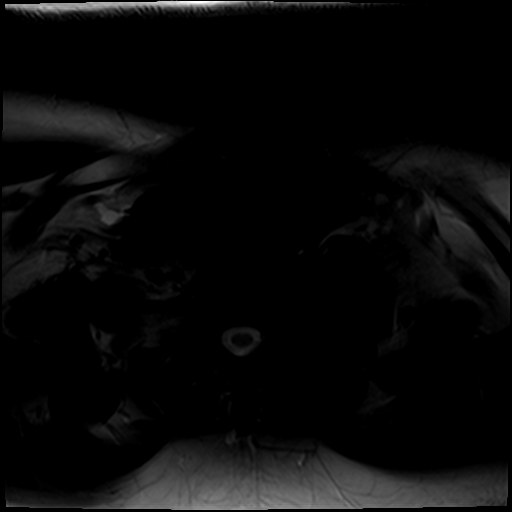
[im 15/30]
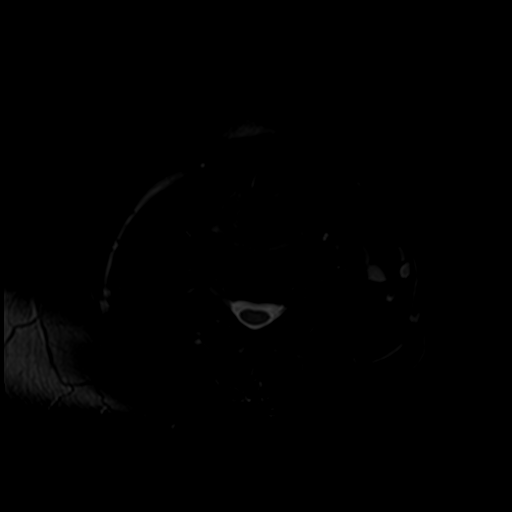
[im 22/30]
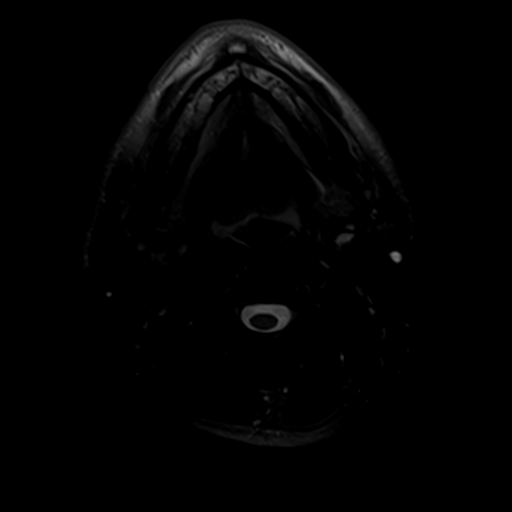
[im 30/30]
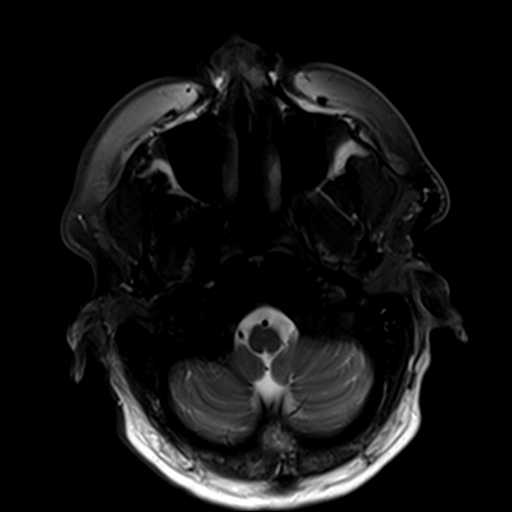

[19 of 48 positions shown; findings below may reference images not displayed]

FINDINGS: Pharynx and larynx: No evidence of mass or inflammation

Salivary glands: Unremarkable

Thyroid: Normal

Lymph nodes: None enlarged or heterogeneous.

Vascular: Unremarkable

Limited intracranial: Only minimal coverage of the cerebellum and
lower brainstem which is unremarkable.

Visualized orbits: Not covered

Mastoids and visualized paranasal sinuses: Clear.

Skeleton: Normal.

Upper chest: Negative

Other: There may be a palpable marker at the jugular notch. No
regional finding to explain symptoms.
IMPRESSION: Negative neck MRI.

## 2021-11-19 MED ORDER — GADOBENATE DIMEGLUMINE 529 MG/ML IV SOLN
20.0000 mL | Freq: Once | INTRAVENOUS | Status: AC | PRN
  Administered 2021-11-19: 20 mL via INTRAVENOUS

## 2021-11-20 ENCOUNTER — Encounter: Payer: Self-pay | Admitting: Physical Therapy

## 2021-11-23 ENCOUNTER — Other Ambulatory Visit: Payer: Self-pay

## 2021-11-23 ENCOUNTER — Ambulatory Visit: Payer: BC Managed Care – PPO | Admitting: Physical Therapy

## 2021-11-23 DIAGNOSIS — G8929 Other chronic pain: Secondary | ICD-10-CM

## 2021-11-23 DIAGNOSIS — R2689 Other abnormalities of gait and mobility: Secondary | ICD-10-CM

## 2021-11-23 DIAGNOSIS — M62838 Other muscle spasm: Secondary | ICD-10-CM

## 2021-11-23 DIAGNOSIS — R293 Abnormal posture: Secondary | ICD-10-CM

## 2021-11-23 NOTE — Patient Instructions (Addendum)
° ° ° °  During the day:  Stretches : (Cuing provided for proper alignment6 directions 6 directions of spine   Cat /cow  sidebend Rotation with trunk   ___  kitchen counter stretches  Hands on kitchen counter, for the back   Palms shoulder width apart  Minisquat postion Trunk is parallel to floor  A) Pull buttocks back to lengthen spine, knees bent  3 breaths   B) Bring R hand to the L, and stretch the R side trunk  3 breaths   Brings hands to center again Do the same to the L side stretch by placing L hand on top of R   D) Modified thread the needle R hand on L thigh, L  thigh pushing out slightly as the R hands pull in,  elbow bent and pulls to theR,  Look under L armpit   Do the same to other side  ____  Lower body:   Figure -4   Hip flexor, by the wall, L arm up, L leg back   Figure-4 stretch    ___   BODY MECHANICS    Transition from standing to floor :  stand to floor transfer :      _ slow     _ mini squat      _ crawl down with one hand on thigh      _downward dog  - >  shoulders down and back-  walk the dog ( knee bents to lengthe hamstrings)      Floor to stand :   downward dog   Feet are wider than hips, crawl hands back, butt is back, knees behind toes -> squat  Hands at waist , elbows back, chest lifts   __  Standing at counter, baby diaper changing table :  Stand 45 deg , boxer stance   ___  Sitting with thumb to fingers practices for relaxed posture

## 2021-11-24 ENCOUNTER — Ambulatory Visit: Payer: BC Managed Care – PPO | Admitting: Family Medicine

## 2021-11-24 ENCOUNTER — Other Ambulatory Visit: Payer: Self-pay | Admitting: Neurology

## 2021-11-24 DIAGNOSIS — R519 Headache, unspecified: Secondary | ICD-10-CM

## 2021-11-24 DIAGNOSIS — E569 Vitamin deficiency, unspecified: Secondary | ICD-10-CM
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
## 2021-11-26 ENCOUNTER — Ambulatory Visit
Admission: RE | Admit: 2021-11-26 | Discharge: 2021-11-26 | Disposition: A | Discharge status: Home / Self Care | Payer: BC Managed Care – PPO | Source: Ambulatory Visit | Attending: Otolaryngology | Admitting: Otolaryngology

## 2021-11-26 DIAGNOSIS — R519 Headache, unspecified: Secondary | ICD-10-CM

## 2021-11-26 DIAGNOSIS — E569 Vitamin deficiency, unspecified: Secondary | ICD-10-CM
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
## 2021-11-26 MED ORDER — GADOBENATE DIMEGLUMINE 529 MG/ML IV SOLN
20.0000 mL | Freq: Once | INTRAVENOUS | Status: AC | PRN
  Administered 2021-11-26: 20 mL via INTRAVENOUS

## 2021-11-30 ENCOUNTER — Encounter: Payer: BC Managed Care – PPO | Admitting: Physical Therapy

## 2021-12-07 ENCOUNTER — Encounter: Payer: Self-pay | Admitting: Urology

## 2021-12-07 ENCOUNTER — Ambulatory Visit (INDEPENDENT_AMBULATORY_CARE_PROVIDER_SITE_OTHER): Payer: BC Managed Care – PPO | Admitting: Urology

## 2021-12-07 ENCOUNTER — Encounter: Payer: Self-pay | Admitting: Physical Therapy

## 2021-12-07 ENCOUNTER — Other Ambulatory Visit: Payer: Self-pay

## 2021-12-07 ENCOUNTER — Telehealth: Payer: Self-pay

## 2021-12-07 VITALS — BP 156/99 | HR 86 | Ht 73.0 in | Wt 248.0 lb

## 2021-12-07 DIAGNOSIS — N419 Inflammatory disease of prostate, unspecified: Secondary | ICD-10-CM

## 2021-12-07 DIAGNOSIS — R399 Unspecified symptoms and signs involving the genitourinary system: Secondary | ICD-10-CM

## 2021-12-07 DIAGNOSIS — R339 Retention of urine, unspecified: Secondary | ICD-10-CM | POA: Diagnosis not present

## 2021-12-07 DIAGNOSIS — R1030 Lower abdominal pain, unspecified: Secondary | ICD-10-CM

## 2021-12-07 DIAGNOSIS — R102 Pelvic and perineal pain: Secondary | ICD-10-CM | POA: Diagnosis not present

## 2021-12-07 LAB — URINALYSIS, COMPLETE
Bilirubin, UA: NEGATIVE
Glucose, UA: NEGATIVE
Ketones, UA: NEGATIVE
Leukocytes,UA: NEGATIVE
Nitrite, UA: NEGATIVE
Protein,UA: NEGATIVE
RBC, UA: NEGATIVE
Specific Gravity, UA: 1.01 (ref 1.005–1.030)
Urobilinogen, Ur: 0.2 mg/dL (ref 0.2–1.0)
pH, UA: 6.5 (ref 5.0–7.5)

## 2021-12-07 LAB — MICROSCOPIC EXAMINATION
Bacteria, UA: NONE SEEN
Epithelial Cells (non renal): NONE SEEN /hpf (ref 0–10)
WBC, UA: NONE SEEN /hpf (ref 0–5)

## 2021-12-07 LAB — BLADDER SCAN AMB NON-IMAGING: Scan Result: >684

## 2021-12-07 MED ORDER — TAMSULOSIN HCL 0.4 MG PO CAPS: 0.4000 mg | ORAL_CAPSULE | Freq: Two times a day (BID) | ORAL | 1 refills | Status: DC

## 2021-12-07 MED ORDER — OXYBUTYNIN CHLORIDE 5 MG PO TABS: ORAL_TABLET | ORAL | 0 refills | Status: DC

## 2021-12-07 NOTE — Progress Notes (Signed)
Simple Catheter Placement ? ?Due to urinary retention patient is present today for a foley cath placement.  Patient was cleaned and prepped in a sterile fashion with betadine. A 16 silicone FR foley catheter was inserted, urine return was noted  846m, urine was yellow in color.  The balloon was filled with 10cc of sterile water.  A leg bag was attached for drainage. Patient was also given a night bag to take home and was given instruction on how to change from one bag to another.  Patient was given instruction on proper catheter care.  Patient tolerated well, no complications were noted  ? ?Performed by: CElberta Leatherwood COdessa? ?Additional notes/ Follow up: 1 week voiding trial  ?

## 2021-12-07 NOTE — Telephone Encounter (Signed)
Rx oxybutynin was sent but let him know I would recommend stopping 48 hours prior to catheter removal as they can affect his ability to empty the bladder ?

## 2021-12-07 NOTE — Telephone Encounter (Signed)
Pt wife called asking if pt can have a prescription for bladder spasms. ?

## 2021-12-07 NOTE — Telephone Encounter (Signed)
Notified patient as instructed, patient pleased °

## 2021-12-08 ENCOUNTER — Encounter: Payer: Self-pay | Admitting: Urology

## 2021-12-08 ENCOUNTER — Other Ambulatory Visit: Payer: Self-pay | Admitting: Urology

## 2021-12-09 ENCOUNTER — Encounter: Payer: BC Managed Care – PPO | Admitting: Physical Therapy

## 2021-12-09 ENCOUNTER — Encounter: Payer: Self-pay | Admitting: Urology

## 2021-12-09 ENCOUNTER — Telehealth: Payer: Self-pay | Admitting: Family Medicine

## 2021-12-09 NOTE — Progress Notes (Signed)
? ?12/07/2021 ?7:20 AM  ? ?Seth Calderon ?[birth year] ?[id number] ? ?Referring provider: Orpah Greek, DO ?[address] ?[place],  [place] [address] ? ?Chief Complaint  ?Patient presents with  ? Prostatitis  ? ? ?HPI: ?Seth Calderon is a 31 y.o. male referred for pelvic pain and lower urinary tract symptoms. ? ?Intermittent symptoms for the past several years but worsening over the past several months ?Complains of penile pain not related to voiding described as a burning sensation.  He has scrotal discomfort and low back pain and left abdominal pain ?Voiding symptoms include frequency, urgency, weak urinary stream and straining to urinate ?Denies gross hematuria, fever.  Intermittent nausea ?Appendectomy 09/2021 with an extended recovery ?On tamsulosin ~ 1 month by PCP with mild improvement ?Has GI appointment tomorrow for evaluation of abdominal pain ?CT abdomen/pelvis 09/2021 showed no significant GU abnormalities ?[facility] abdomen[facility]pelvis [facility] 11/03/2021 also unremarkable ? ? ?PMH: ?Past Medical History:  ?Diagnosis Date  ? Hypertension   ? ? ?Surgical History: ?Past Surgical History:  ?Procedure Laterality Date  ? APPENDECTOMY    ? ? ?Home Medications:  ?Allergies as of 12/07/2021   ? ?   Reactions  ? Latex Swelling  ? Cefaclor Other (See Comments)  ? Mom told him this when he was under 27yo.  ? ?  ? ?  ?Medication List  ?  ? ?  ? Accurate as of December 07, 2021 11:59 PM. If you have any questions, ask your nurse or doctor.  ?  ?  ? ?  ? ?STOP taking these medications   ? ?Advair HFA 230-21 MCG/ACT inhaler ?Generic drug: fluticasone-salmeterol ?Stopped by: Abbie Sons, MD ?  ?tiotropium 18 MCG inhalation capsule ?Commonly known as: SPIRIVA ?Stopped by: Abbie Sons, MD ?  ? ?  ? ?TAKE these medications   ? ?amphetamine-dextroamphetamine 20 MG tablet ?Commonly known as: ADDERALL ?Take 20 mg by mouth daily. ?What changed: Another medication with the same name was removed. Continue taking this  medication, and follow the directions you see here. ?Changed by: Abbie Sons, MD ?  ?amphetamine-dextroamphetamine 10 MG tablet ?Commonly known as: ADDERALL ?Take 10 mg by mouth. With lunch ?What changed: Another medication with the same name was removed. Continue taking this medication, and follow the directions you see here. ?Changed by: Abbie Sons, MD ?  ?buPROPion 300 MG 24 hr tablet ?Commonly known as: WELLBUTRIN XL ?  ?chlorthalidone 25 MG tablet ?Commonly known as: HYGROTON ?Take 25 mg by mouth daily. ?  ?clindamycin 1 % external solution ?Commonly known as: CLEOCIN T ?Apply to affected areas scalp 1-2 times a day as needed. ?  ?clonazePAM 1 MG tablet ?Commonly known as: KLONOPIN ?Take 1 mg by mouth daily as needed. ?  ?Dulera 200-5 MCG/ACT Aero ?Generic drug: mometasone-formoterol ?Inhale 2 puffs into the lungs 2 (two) times daily. ?  ?fluocinonide 0.05 % external solution ?Commonly known as: LIDEX ?Spot treat affected areas scalp 1-2 times a day as needed for flares. ?  ?ibuprofen 800 MG tablet ?Commonly known as: ADVIL ?Take 800 mg by mouth every 8 (eight) hours as needed. ?  ?ketoconazole 2 % cream ?Commonly known as: NIZORAL ?Apply to affected areas body 1-2 times a day prn flares of tinea versicolor. ?  ?ketoconazole 2 % shampoo ?Commonly known as: NIZORAL ?Massage into scalp, let sit several minutes before rinsing. May also use as a body was for tinea versicolor. ?  ?losartan 25 MG tablet ?Commonly known as: COZAAR ?Take 25 mg  by mouth daily. ?  ?methocarbamol 500 MG tablet ?Commonly known as: ROBAXIN ?Take 500 mg by mouth 4 (four) times daily. ?  ?mupirocin ointment 2 % ?Commonly known as: BACTROBAN ?Apply to open areas on body 1-2 times a day until healed. Cover with bandage and avoid picking. ?  ?oxybutynin 5 MG tablet ?Commonly known as: DITROPAN ?1 tab tid prn bladder spasm ?Started by: Alvera Novel, CMA ?  ?Qbrexza 2.4 % Pads ?Generic drug: Glycopyrronium Tosylate ?Wipe one pad to  each underarm every evening after showering. ?  ?tamsulosin 0.4 MG Caps capsule ?Commonly known as: FLOMAX ?Take 1 capsule (0.4 mg total) by mouth 2 (two) times daily. ?What changed: when to take this ?Changed by: Abbie Sons, MD ?  ? ?  ? ? ?Allergies:  ?Allergies  ?Allergen Reactions  ? Latex Swelling  ? Cefaclor Other (See Comments)  ?  Mom told him this when he was under 70yo.  ? ? ?Family History: ?No family history on file. ? ?Social History:  reports that he has never smoked. He has never been exposed to tobacco smoke. He has never used smokeless tobacco. He reports that he does not drink alcohol and does not use drugs. ? ? ?Physical Exam: ?BP (!) 156/99   Pulse 86   Ht '6\' 1"'$  (1.854 m)   Wt 248 lb (112.5 kg)   BMI 32.72 kg/m?   ?Constitutional:  Alert and oriented, No acute distress. ?HEENT: [place] AT, moist mucus membranes.  Trachea midline, no masses. ?Cardiovascular: No clubbing, cyanosis, or edema. ?Respiratory: Normal respiratory effort, no increased work of breathing. ?GU: Phallus without lesions.  Meatus normal.  Testes descended bilateral without masses or tenderness.  Spermatic cord/epididymis palpably normal bilaterally.  Prostate 30 g mildly tender.  Increased pelvic floor tenderness ? ? ? ?Pertinent Imaging: ?CT abdomen pelvis 10/2021 images were personally reviewed and interpreted ? ?Assessment & Plan:   ? ?1.  Lower urinary tract symptoms ?PVR initially >684 mL; he was asked to try and empty completely and repeat PVR 511 mL ? ?2.  Urinary retention ?As above ?Foley catheter placement recommended x1 week ?Increase tamsulosin to 0.8 mg daily ?Follow-up 1 week for voiding trial ? ?3.  Pelvic/abdominal pain ?Urinary retention may be a contributing factor and reassess pain after Foley catheter drainage ? ? ?Abbie Sons, MD ? ?[facility] ?[address] ?[place], [place] [address] ?[phone] ? ?

## 2021-12-09 NOTE — Telephone Encounter (Signed)
Patient called stating he has had bright red blood and stringy blood clots in his catheter bag. He states it has gotten better through out the day. He states he has had left lower abdominal pain and also some pain on the lower right side. He states he has not had a bowel movement in a couple days. He is concerned he may have some GI issues and is going to see a GI doctor for further evaluation. I informed him that lower left side pain could be indication of needing to have a bowel movement. I did see on his medication list that he takes Miralax. He states he will take this as well as a dulcolax to try to have a bowel movement. He states the tip of his penis and the urethra is also giving him discomfort. I informed him that the catheter will cause some discomfort. He was prescribed Oxybutynin for bladder spasms. I informed him to take Ibuprofen if the pain does not subside. I told him that if the urine in the catheter bag looks like Merlot or there are dark large blood clots or no urine is going into the bag we will need to know this. Patient voiced understanding.  ?

## 2021-12-10 ENCOUNTER — Telehealth: Payer: Self-pay | Admitting: *Deleted

## 2021-12-10 NOTE — Telephone Encounter (Signed)
Patient came by the office today for a leg strip. He states the cathter was pulling on the edge of his penis. We went into a room . Look at his sticking pad and it was in the right position . It was not pulling the cathter down . He had a little res spot of the edge that was tender. He has been putting some ora-gel on there.  ?

## 2021-12-11 ENCOUNTER — Ambulatory Visit: Payer: BC Managed Care – PPO | Admitting: Physical Therapy

## 2021-12-13 ENCOUNTER — Encounter: Payer: Self-pay | Admitting: Urology

## 2021-12-14 ENCOUNTER — Other Ambulatory Visit: Payer: Self-pay | Admitting: Internal Medicine

## 2021-12-14 ENCOUNTER — Telehealth: Payer: Self-pay

## 2021-12-14 DIAGNOSIS — R339 Retention of urine, unspecified: Secondary | ICD-10-CM

## 2021-12-14 DIAGNOSIS — M546 Pain in thoracic spine: Secondary | ICD-10-CM

## 2021-12-14 DIAGNOSIS — R29898 Other symptoms and signs involving the musculoskeletal system: Secondary | ICD-10-CM

## 2021-12-14 NOTE — Telephone Encounter (Signed)
Incoming call on triage line from patient's wife (Dr. Su Hoff, DNP) who states that the patient is scheduled for a catheter removal appt tomorrow, however she states that the patient cannot wait that long. She states that the patient is in pain (pain in penis, flank pain, and bladder spasms). They have tried Tylenol, Motrin, and Oxybutynin with no relief. They have noticed an increase in blood in the urine over the weekend and it is more cloudy. The patient denies fever or chills. The wife states that at this point she would removed the catheter herself but does not have the supplies. I advised pt's wife that Dr. Bernardo Heater is not in office today, however I would seek recommendations from another provider.  ?

## 2021-12-14 NOTE — Telephone Encounter (Signed)
Tried calling pt phone rang, picked up and hung up. Will try again later. Pt scheduled for nurse visit 12/15/2021. ?

## 2021-12-15 ENCOUNTER — Ambulatory Visit (INDEPENDENT_AMBULATORY_CARE_PROVIDER_SITE_OTHER): Payer: BC Managed Care – PPO | Admitting: Urology

## 2021-12-15 ENCOUNTER — Other Ambulatory Visit: Payer: Self-pay

## 2021-12-15 ENCOUNTER — Encounter: Payer: BC Managed Care – PPO | Admitting: Physical Therapy

## 2021-12-15 ENCOUNTER — Ambulatory Visit: Payer: BC Managed Care – PPO | Admitting: *Deleted

## 2021-12-15 DIAGNOSIS — R339 Retention of urine, unspecified: Secondary | ICD-10-CM | POA: Diagnosis not present

## 2021-12-15 LAB — BLADDER SCAN AMB NON-IMAGING: Scan Result: 217

## 2021-12-15 MED ORDER — OXYBUTYNIN CHLORIDE 5 MG PO TABS: ORAL_TABLET | ORAL | 0 refills | Status: DC

## 2021-12-15 NOTE — Progress Notes (Signed)
Catheter Removal ? ?Patient is present today for a catheter removal.  67m of water was drained from the balloon. A 16FR foley cath was removed from the bladder no complications were noted . Patient tolerated well. ? ?Performed by: JGaspar ColaCMA ? ?Follow up/ Additional notes: This afternoon   ?

## 2021-12-16 ENCOUNTER — Other Ambulatory Visit: Payer: Self-pay | Admitting: Internal Medicine

## 2021-12-16 DIAGNOSIS — R29898 Other symptoms and signs involving the musculoskeletal system: Secondary | ICD-10-CM

## 2021-12-16 DIAGNOSIS — R339 Retention of urine, unspecified: Secondary | ICD-10-CM

## 2021-12-16 DIAGNOSIS — M546 Pain in thoracic spine: Secondary | ICD-10-CM

## 2021-12-17 LAB — URINALYSIS, COMPLETE
Bilirubin, UA: NEGATIVE
Glucose, UA: NEGATIVE
Ketones, UA: NEGATIVE
Nitrite, UA: NEGATIVE
Protein,UA: NEGATIVE
Specific Gravity, UA: 1.015 (ref 1.005–1.030)
Urobilinogen, Ur: 0.2 mg/dL (ref 0.2–1.0)
pH, UA: 7 (ref 5.0–7.5)

## 2021-12-17 LAB — MICROSCOPIC EXAMINATION: WBC, UA: >30 /hpf — AB (ref 0–5)

## 2021-12-18 ENCOUNTER — Encounter: Payer: Self-pay | Admitting: Cardiology

## 2021-12-20 ENCOUNTER — Encounter: Payer: Self-pay | Admitting: Urology

## 2021-12-20 NOTE — Progress Notes (Signed)
? ? ?12/15/2021 ?4:40 PM  ? ?Seth Calderon ?1991/01/28 ?[id number] ? ?Referring provider: [facility] ?[address] ?[place],  [place] [address] ? ?Chief Complaint  ?Patient presents with  ? Other  ? ?HPI: ?Seth Calderon is a 31 y.o. male who was found to be in urinary retention at his appointment with Dr. Bernardo Heater on December 07, 2021 where he was referred for lower urinary tract symptoms and pelvic pain.  Bladder scan found a PVR initially greater than 684 mL and when he tried to empty his repeat PVR was 511 mL. ? ?A Foley catheter was placed at that visit and his tamsulosin was increased to 0 point milligrams daily. ? ?His wife, Elder Negus, is present at his visit today.   ? ?His Foley catheter was removed this morning. ? ?He presents this afternoon for follow-up PVR.  His bladder scan notes a PVR of 217 mL.  He continues to have frequency, urgency, a weak urinary stream and straining to urinate.  He also continues to have penile pain. ? ?Patient denies any modifying or aggravating factors.  Patient denies any gross hematuria, dysuria or suprapubic/flank pain.  Patient denies any fevers, chills, nausea or vomiting.   ? ?UA <30 WBC's.   ? ?PMH: ?Past Medical History:  ?Diagnosis Date  ? Hypertension   ? ? ?Surgical History: ?Past Surgical History:  ?Procedure Laterality Date  ? APPENDECTOMY    ? ? ?Home Medications:  ?Allergies as of 12/15/2021   ? ?   Reactions  ? Latex Swelling  ? Cefaclor Other (See Comments)  ? Mom told him this when he was under 37yo.  ? ?  ? ?  ?Medication List  ?  ? ?  ? Accurate as of December 15, 2021 11:59 PM. If you have any questions, ask your nurse or doctor.  ?  ?  ? ?  ? ?amphetamine-dextroamphetamine 20 MG tablet ?Commonly known as: ADDERALL ?Take 20 mg by mouth daily. ?  ?amphetamine-dextroamphetamine 10 MG tablet ?Commonly known as: ADDERALL ?Take 10 mg by mouth. With lunch ?  ?buPROPion 300 MG 24 hr tablet ?Commonly known as: WELLBUTRIN XL ?  ?chlorthalidone  25 MG tablet ?Commonly known as: HYGROTON ?Take 25 mg by mouth daily. ?  ?clindamycin 1 % external solution ?Commonly known as: CLEOCIN T ?Apply to affected areas scalp 1-2 times a day as needed. ?  ?clonazePAM 1 MG tablet ?Commonly known as: KLONOPIN ?Take 1 mg by mouth daily as needed. ?  ?Dulera 200-5 MCG/ACT Aero ?Generic drug: mometasone-formoterol ?Inhale 2 puffs into the lungs 2 (two) times daily. ?  ?fluocinonide 0.05 % external solution ?Commonly known as: LIDEX ?Spot treat affected areas scalp 1-2 times a day as needed for flares. ?  ?ibuprofen 800 MG tablet ?Commonly known as: ADVIL ?Take 800 mg by mouth every 8 (eight) hours as needed. ?  ?ketoconazole 2 % cream ?Commonly known as: NIZORAL ?Apply to affected areas body 1-2 times a day prn flares of tinea versicolor. ?  ?ketoconazole 2 % shampoo ?Commonly known as: NIZORAL ?Massage into scalp, let sit several minutes before rinsing. May also use as a body was for tinea versicolor. ?  ?losartan 25 MG tablet ?Commonly known as: COZAAR ?Take 25 mg by mouth daily. ?  ?methocarbamol 500 MG tablet ?Commonly known as: ROBAXIN ?Take 500 mg by mouth 4 (four) times daily. ?  ?mupirocin ointment 2 % ?Commonly known as: BACTROBAN ?Apply to open areas on body 1-2 times a day until healed.  Cover with bandage and avoid picking. ?  ?oxybutynin 5 MG tablet ?Commonly known as: DITROPAN ?1 tab tid prn bladder spasm ?  ?Qbrexza 2.4 % Pads ?Generic drug: Glycopyrronium Tosylate ?Wipe one pad to each underarm every evening after showering. ?  ?tamsulosin 0.4 MG Caps capsule ?Commonly known as: FLOMAX ?Take 1 capsule (0.4 mg total) by mouth 2 (two) times daily. ?  ? ?  ? ? ?Allergies:  ?Allergies  ?Allergen Reactions  ? Latex Swelling  ? Cefaclor Other (See Comments)  ?  Mom told him this when he was under 82yo.  ? ? ?Family History: ?No family history on file. ? ?Social History:  reports that he has never smoked. He has never been exposed to tobacco smoke. He has never used  smokeless tobacco. He reports that he does not drink alcohol and does not use drugs. ? ?ROS: ?Pertinent ROS in HPI ? ?Physical Exam: ?Constitutional:  Well nourished. Alert and oriented, No acute distress. ?HEENT: [place] AT, mask in place trachea midline ?Cardiovascular: No clubbing, cyanosis, or edema. ?Respiratory: Normal respiratory effort, no increased work of breathing. ?Lymph: No cervical or inguinal adenopathy. ?Neurologic: Grossly intact, no focal deficits, moving all 4 extremities. ?Psychiatric: Normal mood and affect. ? ?Laboratory Data: ?Lab Results  ?Component Value Date  ? WBC 12.1 (H) 11/03/2021  ? HGB 14.7 11/03/2021  ? HCT 42.8 11/03/2021  ? MCV 90.9 11/03/2021  ? PLT 443 (H) 11/03/2021  ? ? ?Lab Results  ?Component Value Date  ? CREATININE 1.09 11/03/2021  ? ? ?Lab Results  ?Component Value Date  ? AST 27 11/03/2021  ? ?Lab Results  ?Component Value Date  ? ALT 27 11/03/2021  ? ?Urinalysis ?Component ?    Latest Ref Rng & Units 12/15/2021  ?Specific Gravity, UA ?    1.005 - 1.030 1.015  ?pH, UA ?    5.0 - 7.5 7.0  ?Color, UA ?    Yellow Straw  ?Appearance Ur ?    Clear Cloudy (A)  ?Leukocytes,UA ?    Negative 2+ (A)  ?Protein,UA ?    Negative/Trace Negative  ?Glucose, UA ?    Negative Negative  ?Ketones, UA ?    Negative Negative  ?RBC, UA ?    Negative 1+ (A)  ?Bilirubin, UA ?    Negative Negative  ?Urobilinogen, Ur ?    0.2 - 1.0 mg/dL 0.2  ?Nitrite, UA ?    Negative Negative  ?Microscopic Examination ?     See below:  ? ?Component ?    Latest Ref Rng & Units 12/15/2021  ?WBC, UA ?    0 - 5 /hpf >30 (A)  ?RBC ?    0 - 2 /hpf 0-2  ?Epithelial Cells (non renal) ?    0 - 10 /hpf 0-10  ?Bacteria, UA ?    None seen/Few Few  ?I have reviewed the labs. ? ? ?Pertinent Imaging: ? 12/15/21 12:57  ?Scan Result 217  ? ? ?Assessment & Plan:   ? ?1. Urinary retention ?- Bladder Scan (Post Void Residual) in office ?- Urinalysis, Complete ?-PVR is under 300, so replacement of the Foley catheter is not warranted at this  time although I did have discussion with him and his wife regarding that his urinary symptoms sound obstructive in nature and he may need a Foley catheter in the future.  He is adamant about never having a catheter again, but his wife is a former Designer, jewellery and she is comfortable with straight-catheterization  technique.  I have given her samples of 46 [place] male [employer] straight catheters to have on hand in case he goes into urinary retention again.   ?-Continue tamsulosin 0.4 mg twice daily ?-He does not wish to follow-up as he is being referred to [facility] and would like them to take over his medical care ? ?Return if symptoms worsen or fail to improve. ? ?These notes generated with voice recognition software. I apologize for typographical errors. ? ?Consetta Cosner, PA-C ? ?[facility] ?[place], [place] [address] ?[phone]  ?I spent 25 minutes on the day of the encounter to include pre-visit record review, face-to-face time with the patient, and post-visit ordering of tests.  ?

## 2021-12-21 ENCOUNTER — Encounter: Payer: BC Managed Care – PPO | Admitting: Physical Therapy

## 2021-12-22 ENCOUNTER — Ambulatory Visit: Payer: BC Managed Care – PPO | Attending: Family Medicine | Admitting: Physical Therapy

## 2021-12-22 ENCOUNTER — Other Ambulatory Visit: Payer: Self-pay

## 2021-12-22 VITALS — BP 128/84 | Temp 99.0°F

## 2021-12-22 DIAGNOSIS — R2689 Other abnormalities of gait and mobility: Secondary | ICD-10-CM | POA: Insufficient documentation

## 2021-12-22 DIAGNOSIS — G8929 Other chronic pain: Secondary | ICD-10-CM | POA: Diagnosis present

## 2021-12-22 DIAGNOSIS — M62838 Other muscle spasm: Secondary | ICD-10-CM | POA: Insufficient documentation

## 2021-12-22 DIAGNOSIS — R293 Abnormal posture: Secondary | ICD-10-CM | POA: Diagnosis present

## 2021-12-22 DIAGNOSIS — M25561 Pain in right knee: Secondary | ICD-10-CM | POA: Diagnosis present

## 2021-12-23 ENCOUNTER — Ambulatory Visit: Payer: BC Managed Care – PPO
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
## 2021-12-23 NOTE — Patient Instructions (Signed)
Buy a rollator and use it in home and community in the event you feel unsteady and weak for safety and balance ? ?

## 2021-12-24 ENCOUNTER — Ambulatory Visit
Admission: RE | Admit: 2021-12-24 | Discharge: 2021-12-24 | Disposition: A | Discharge status: Home / Self Care | Payer: BC Managed Care – PPO | Source: Ambulatory Visit | Attending: Internal Medicine | Admitting: Internal Medicine

## 2021-12-24 ENCOUNTER — Other Ambulatory Visit: Payer: Self-pay

## 2021-12-24 DIAGNOSIS — R339 Retention of urine, unspecified: Secondary | ICD-10-CM

## 2021-12-24 DIAGNOSIS — M546 Pain in thoracic spine: Secondary | ICD-10-CM

## 2021-12-24 DIAGNOSIS — R29898 Other symptoms and signs involving the musculoskeletal system: Secondary | ICD-10-CM

## 2021-12-24 IMAGING — MR MR LUMBAR SPINE WO/W CM
5 of 8 series · 22 of 48 positions shown · IV contrast (multihance)
Comparison: None.

CLINICAL DATA: Progressive neuropathy and weakness in the upper and
lower extremities.

EXAM:
MRI LUMBAR SPINE WITHOUT AND WITH CONTRAST
TECHNIQUE: Multiplanar and multiecho pulse sequences of the lumbar spine were
obtained without and with intravenous contrast.
CONTRAST:  20mL MULTIHANCE GADOBENATE DIMEGLUMINE 529 MG/ML IV SOLN

[Series 3: T1 · sagittal · 3.0mm · 1.50mm/px · 4 of 13 slices shown (1 of 3)]
[im 1/13]
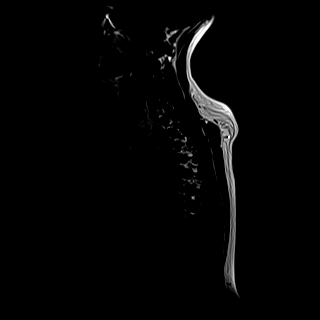
[im 5/13]
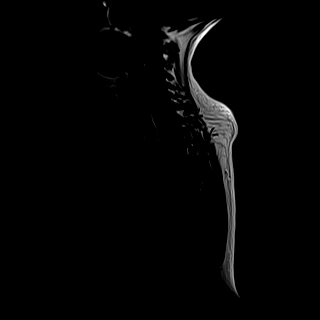
[im 9/13]
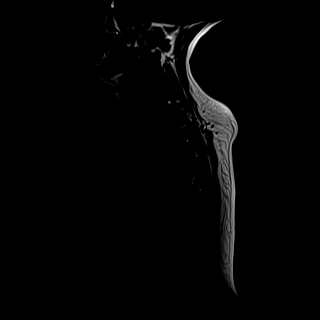
[im 13/13]
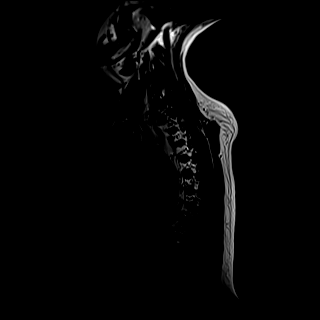

[Series 14: T1 · sagittal · 4.0mm · 0.73mm/px · 4 of 15 slices shown (2 of 3)]
[im 1/15]
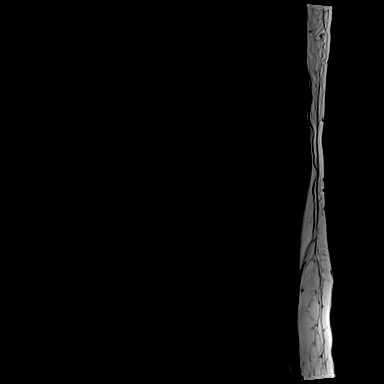
[im 5/15]
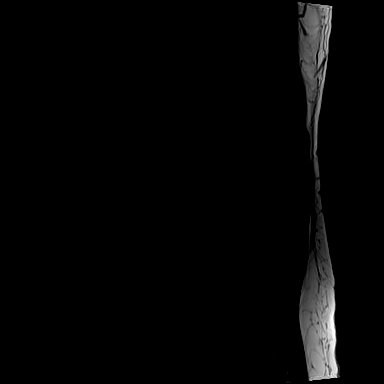
[im 10/15]
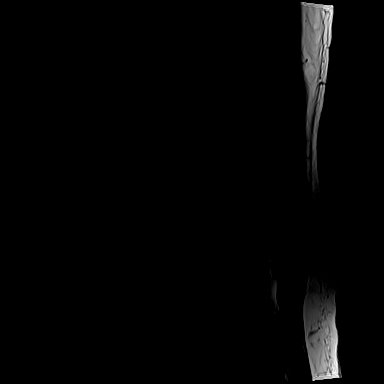
[im 15/15]
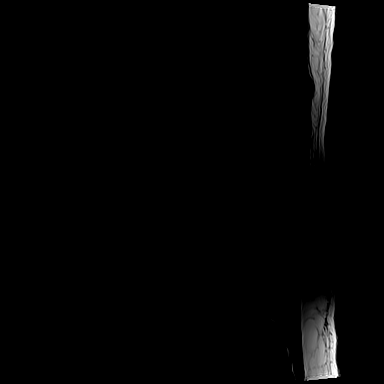

[Series 18: T1 · axial · 4.0mm · 0.28mm/px · z∈[-528,-459]mm · 3 of 45 slices shown (3 of 3)]
[im 1/45]
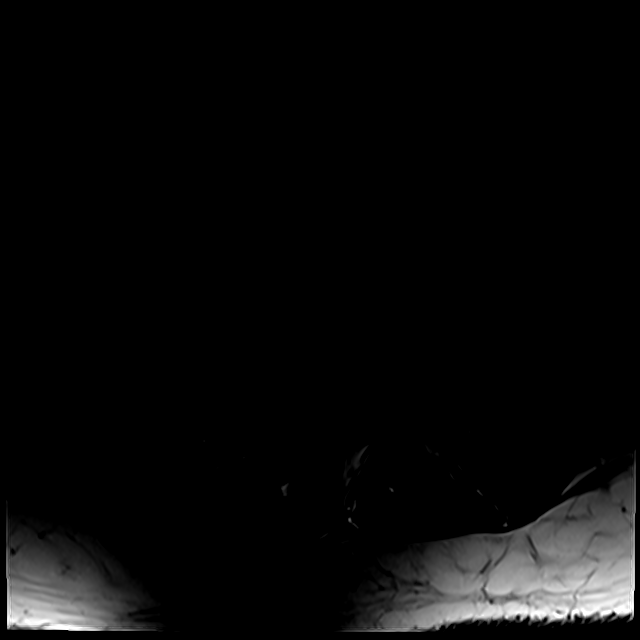
[im 5/45]
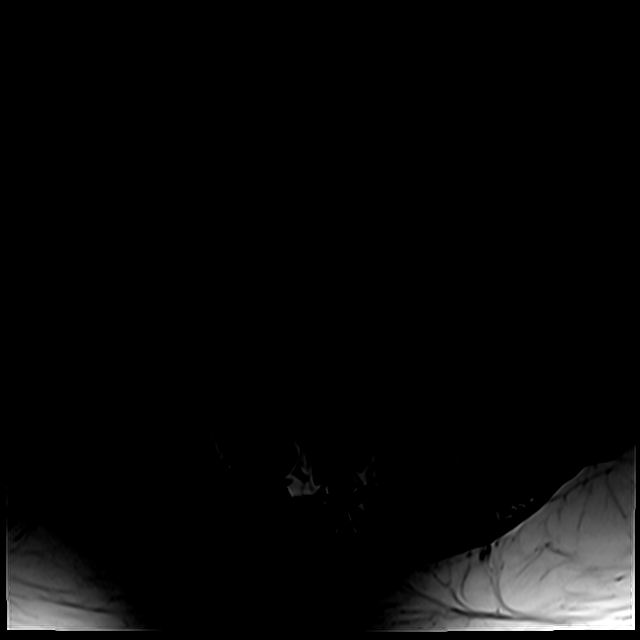
[im 15/45]
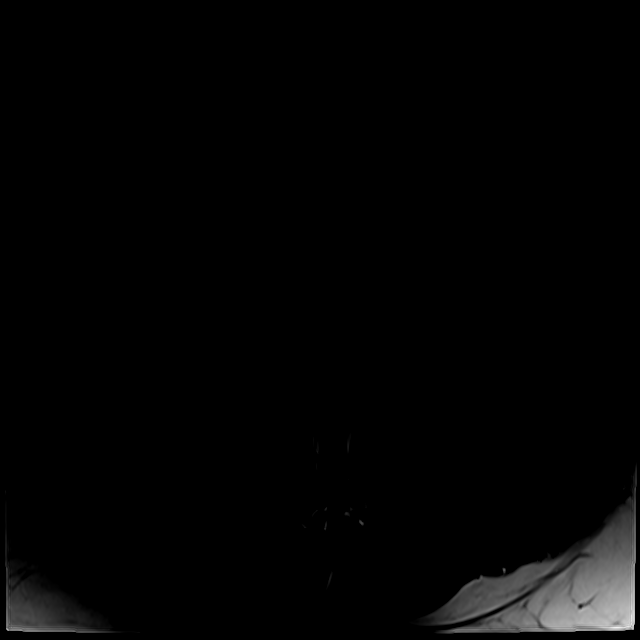

[Series 21: T2 · axial · 4.0mm · 0.28mm/px · z∈[-528,-290]mm · 8 of 45 slices shown (1 of 2)]
[im 1/45]
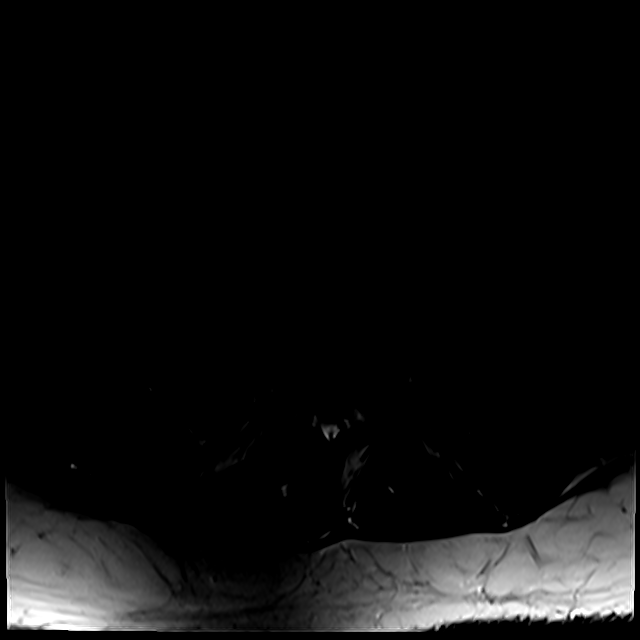
[im 5/45]
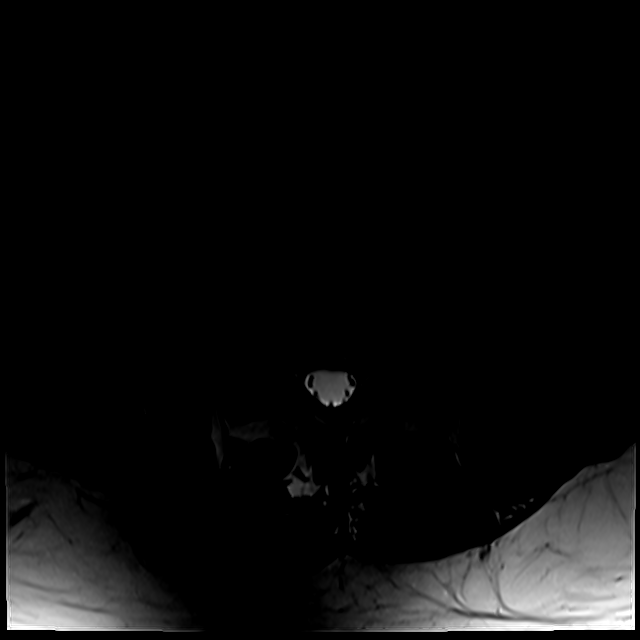
[im 15/45]
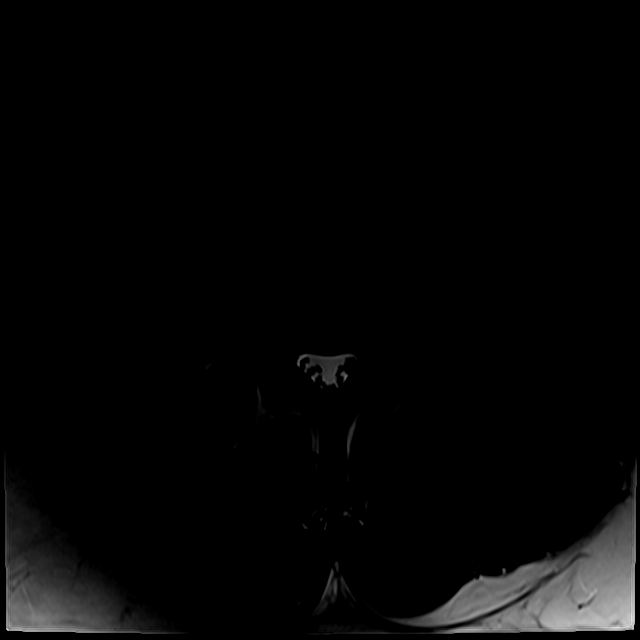
[im 20/45]
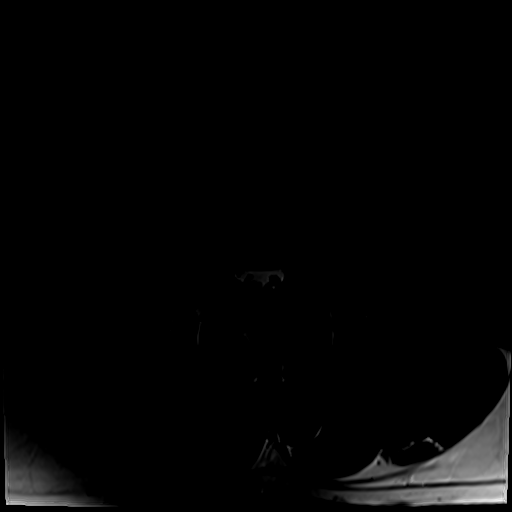
[im 25/45]
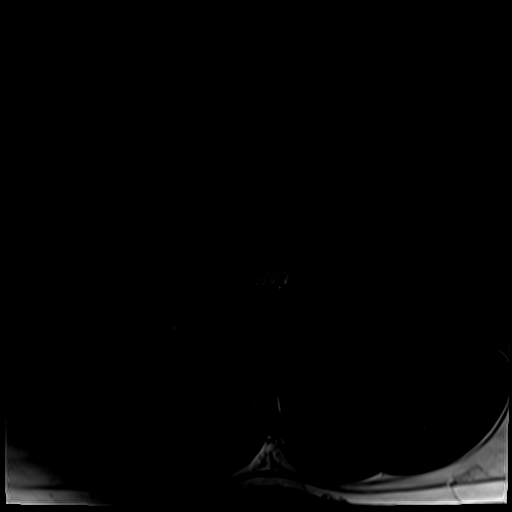
[im 30/45]
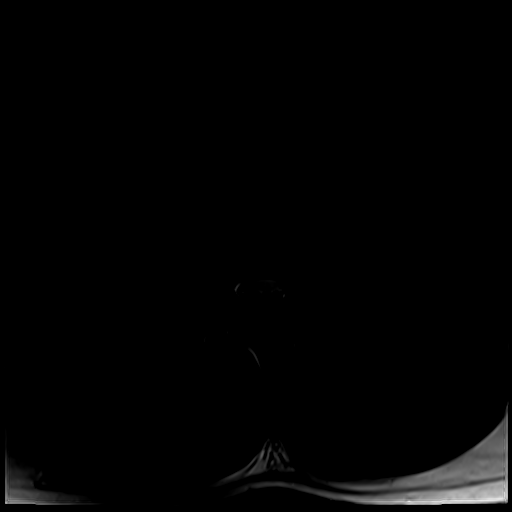
[im 40/45]
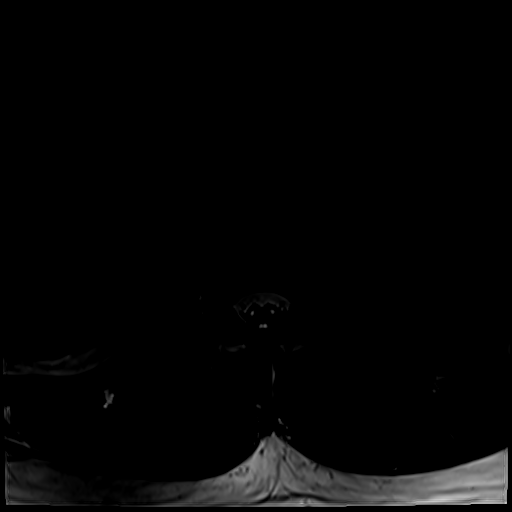
[im 45/45]
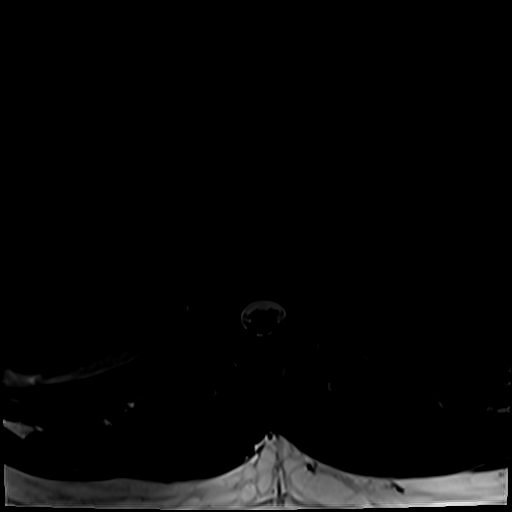

[Series 22: T2 · sagittal · 4.0mm · 0.73mm/px · 3 of 15 slices shown (2 of 2)]
[im 1/15]
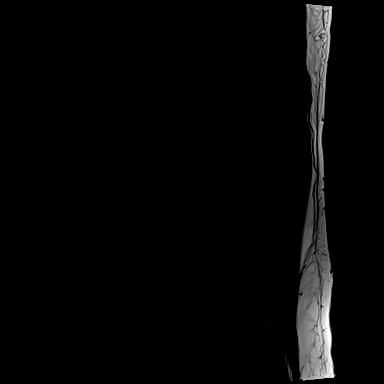
[im 8/15]
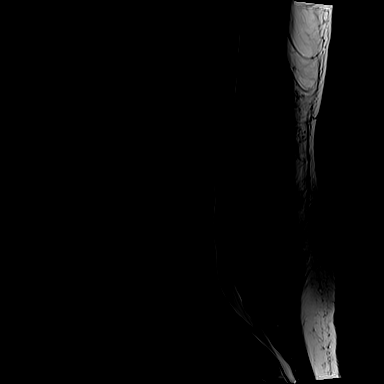
[im 15/15]
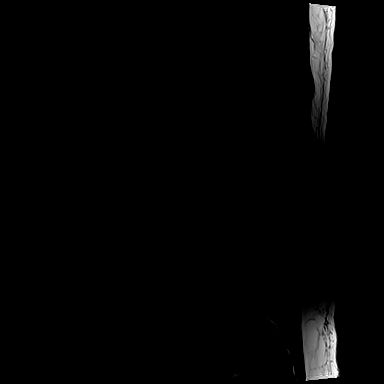

[22 of 48 positions shown; findings below may reference images not displayed]

FINDINGS: Segmentation:  Standard.

Alignment:  Physiologic.

Vertebrae: No acute fracture, evidence of discitis, or aggressive
bone lesion.

Conus medullaris and cauda equina: Conus extends to the L1 level.
Conus and cauda equina appear normal.

Paraspinal and other soft tissues: No acute paraspinal abnormality.

Disc levels:

Disc spaces: Disc spaces are maintained.

T12-L1: No significant disc bulge. No neural foraminal stenosis. No
central canal stenosis.

L1-L2: No significant disc bulge. No neural foraminal stenosis. No
central canal stenosis.

L2-L3: No significant disc bulge. No neural foraminal stenosis. No
central canal stenosis.

L3-L4: No significant disc bulge. No neural foraminal stenosis. No
central canal stenosis.

L4-L5: No significant disc bulge. No neural foraminal stenosis. No
central canal stenosis.

L5-S1: No significant disc bulge. No neural foraminal stenosis. No
central canal stenosis.
IMPRESSION: 1. Normal MRI of the lumbar spine.

## 2021-12-24 IMAGING — MR MR THORACIC SPINE WO/W CM
9 series · 42 of 48 positions shown · IV contrast (multihance)
Comparison: CT a chest [DATE]

CLINICAL DATA: Progressive neuropathy, peripheral weakness in upper
and lower extremities

EXAM:
MRI THORACIC WITHOUT AND WITH CONTRAST
TECHNIQUE: Multiplanar and multiecho pulse sequences of the thoracic spine were
obtained without and with intravenous contrast.
CONTRAST:  20mL MULTIHANCE GADOBENATE DIMEGLUMINE 529 MG/ML IV SOLN

[Series 3: T1 · sagittal · 3.0mm · 1.50mm/px · 4 of 13 slices shown (1 of 3)]
[im 1/13]
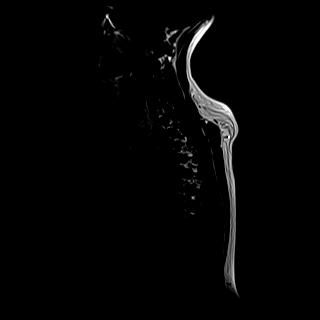
[im 5/13]
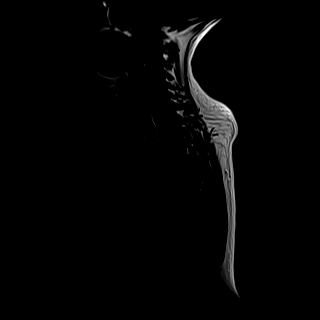
[im 9/13]
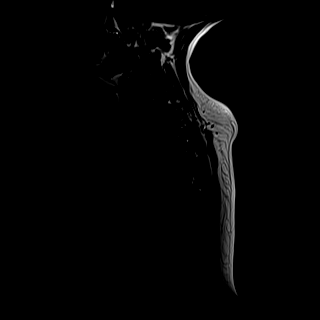
[im 13/13]
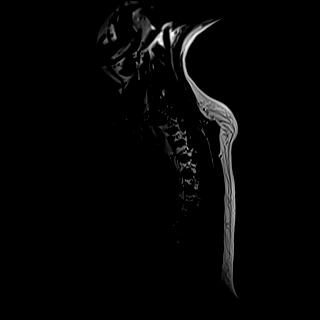

[Series 6: T1 · sagittal · 3.0mm · 1.06mm/px · 3 of 15 slices shown (2 of 3)]
[im 1/15]
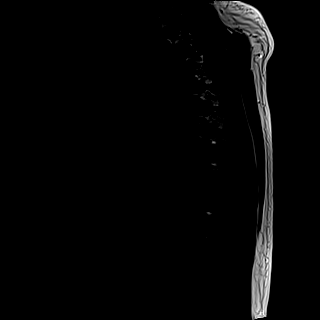
[im 8/15]
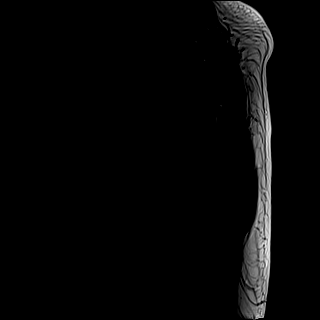
[im 15/15]
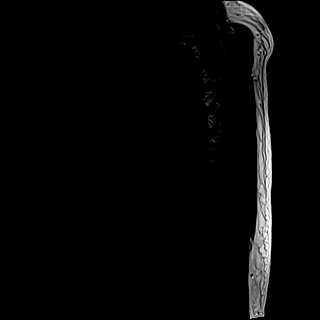

[Series 7: STIR · sagittal · 3.0mm · 1.06mm/px · 3 of 15 slices shown]
[im 1/15]
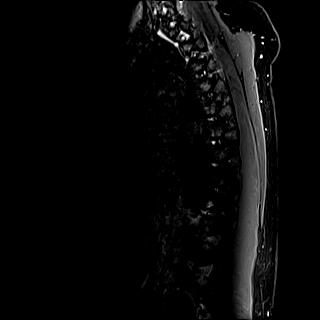
[im 8/15]
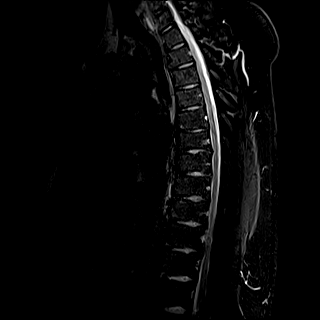
[im 15/15]
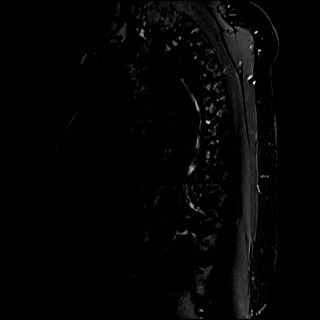

[Series 8: T2 · sagittal · 3.0mm · 1.06mm/px · 3 of 15 slices shown (1 of 2)]
[im 1/15]
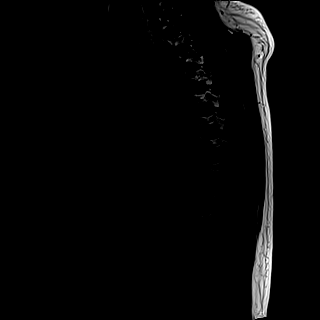
[im 8/15]
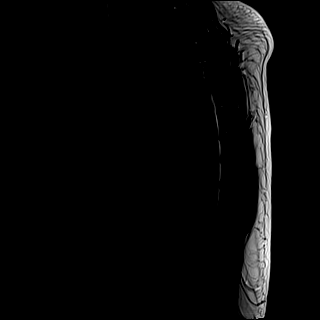
[im 15/15]
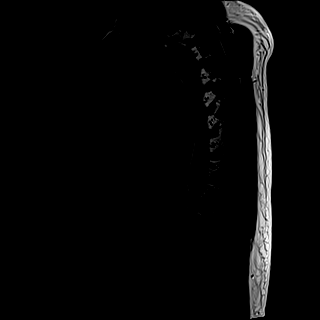

[Series 9: T2 · axial · 4.0mm · 0.62mm/px · z∈[-289,-42]mm · 8 of 36 slices shown (2 of 2)]
[im 1/36]
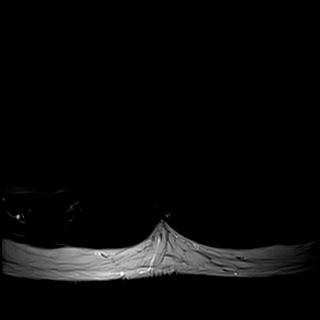
[im 6/36]
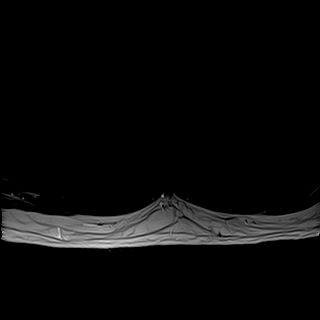
[im 11/36]
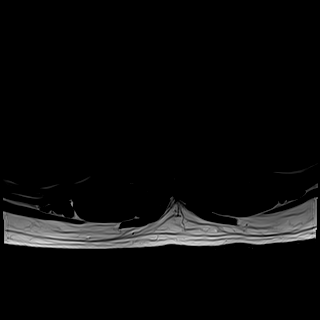
[im 16/36]
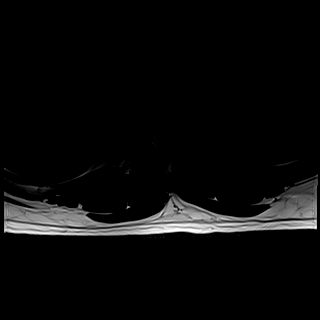
[im 21/36]
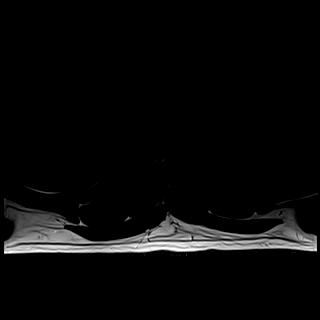
[im 26/36]
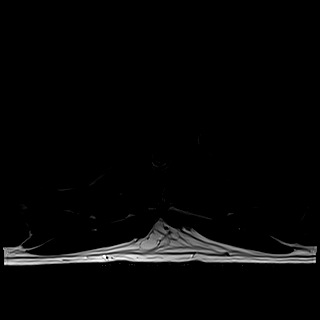
[im 31/36]
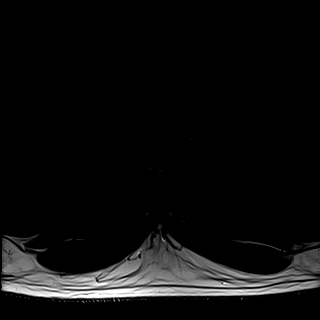
[im 36/36]
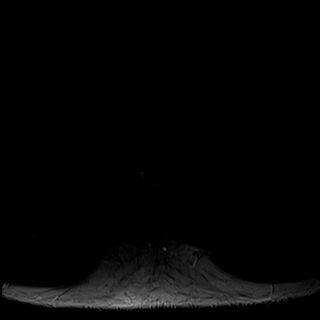

[Series 10: ax mpgr · axial · 4.0mm · 0.39mm/px · z∈[-291,-248]mm · 2 of 36 slices shown]
[im 1/36]
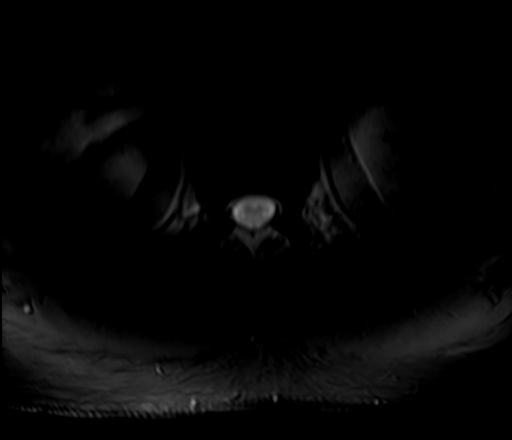
[im 6/36]
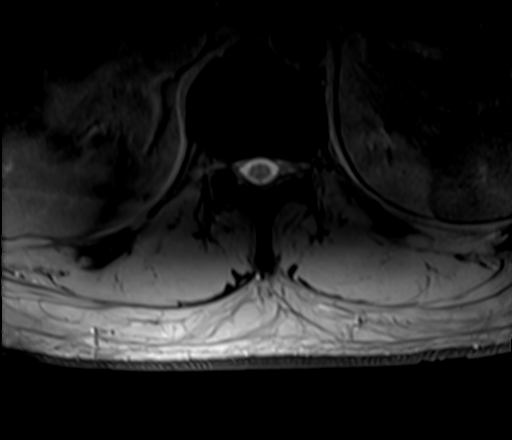

[Series 11: T1 · axial · non-contrast · 4.0mm · 0.62mm/px · z∈[-289,-42]mm · 8 of 36 slices shown (3 of 3)]
[im 1/36]
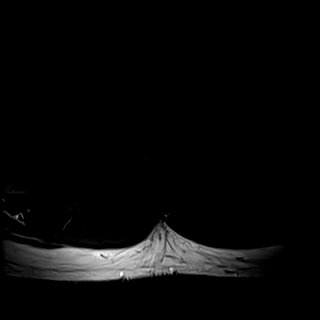
[im 6/36]
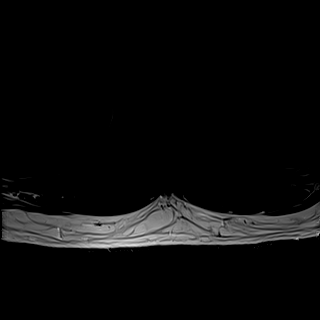
[im 11/36]
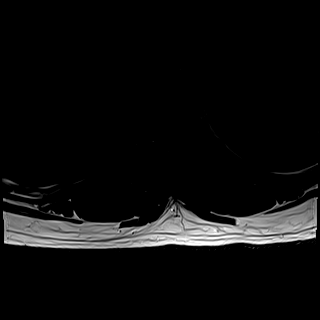
[im 16/36]
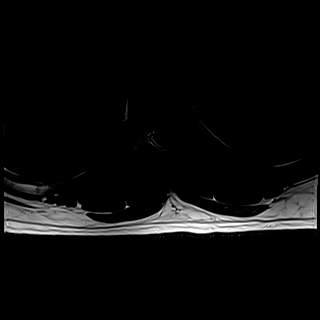
[im 21/36]
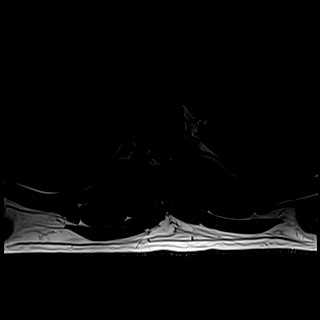
[im 26/36]
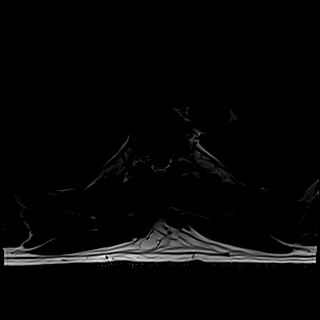
[im 31/36]
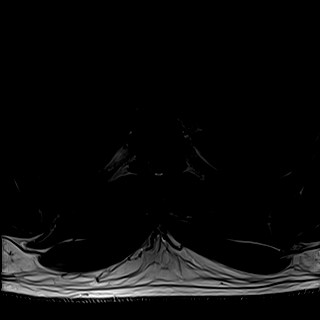
[im 36/36]
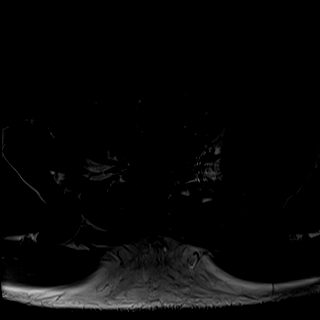

[Series 12: T1 fat-sat post-contrast · sagittal · 3.0mm · 1.06mm/px · 3 of 15 slices shown]
[im 1/15]
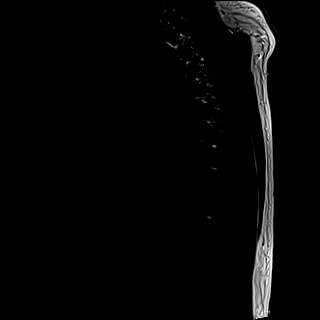
[im 8/15]
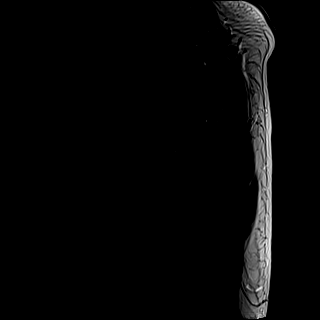
[im 15/15]
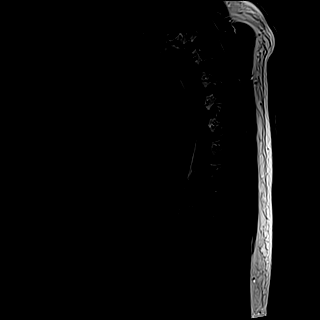

[Series 13: T1 post-contrast · axial · 4.0mm · 0.62mm/px · z∈[-289,-42]mm · 8 of 36 slices shown]
[im 1/36]
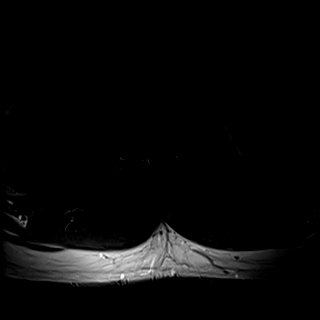
[im 6/36]
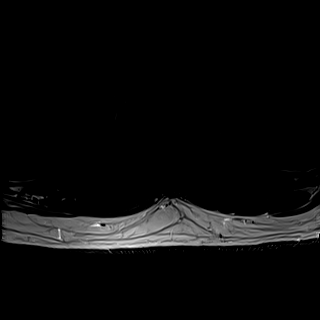
[im 11/36]
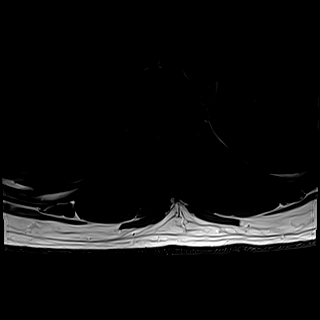
[im 16/36]
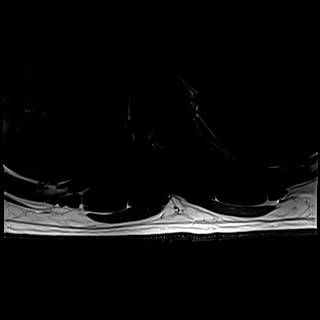
[im 21/36]
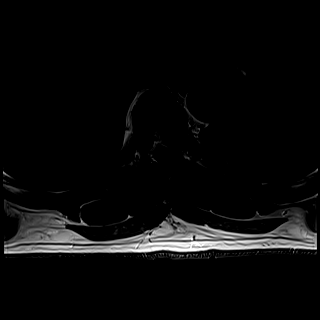
[im 26/36]
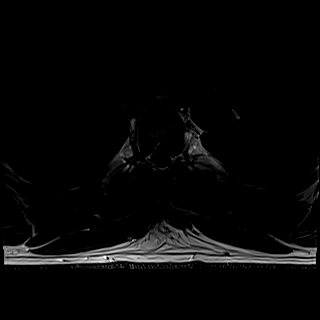
[im 31/36]
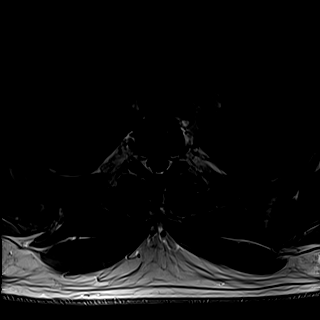
[im 36/36]
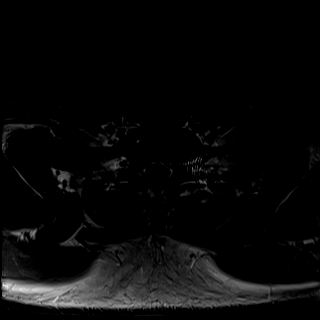

[42 of 48 positions shown; findings below may reference images not displayed]

FINDINGS: Alignment:  Normal.  There is no antero or retrolisthesis.

Vertebrae: Vertebral body heights are preserved. There is an
ill-defined T1 hyperintense lesion in the T6 vertebral body
measuring approximately 1.0 cm, and a peripherally T1 hyperintense
lesion in the T9 vertebral body measuring approximately 1.8 cm.
There is associated T2 hyperintensity with heterogeneous
enhancement. These are favored to reflect intraosseous hemangiomas,
likely also present on the CTA chest from [C1].

There is mild multilevel degenerative endplate change.

Cord: Normal in signal and morphology. There is no abnormal cord
enhancement.

Paraspinal and other soft tissues: Unremarkable.

Disc levels:

The disc heights are overall preserved. There is mild multilevel
degenerative endplate irregularity. There is are mild disc
protrusions at T6-T7 and T7-T8 without significant spinal or neural
foraminal stenosis canal stenosis.

Facet arthropathy contributes to mild bilateral neural foraminal
stenosis at T9-T10.

There is a more prominent disc bulge at T10-T11 with mild bilateral
facet arthropathy resulting in moderate spinal canal stenosis with
flattening of the ventral cord. Facet arthropathy also contributes
to mild-to-moderate left neural foraminal stenosis at this level.

There is no other significant spinal canal or neural foraminal
stenosis at the remaining levels.
IMPRESSION: 1. Disc bulge at T10-T11 resulting in moderate spinal canal stenosis
with flattening of the ventral cord. Facet arthropathy contributes
to mild-to-moderate left neural foraminal stenosis at this level.
2. Otherwise, mild degenerative changes throughout the remainder of
the lumbar spine without other significant spinal canal or neural
foraminal stenosis.

## 2021-12-24 MED ORDER — GADOBENATE DIMEGLUMINE 529 MG/ML IV SOLN
20.0000 mL | Freq: Once | INTRAVENOUS | Status: AC | PRN
  Administered 2021-12-24: 20 mL via INTRAVENOUS

## 2021-12-25 ENCOUNTER — Encounter: Payer: Self-pay | Admitting: Oncology

## 2021-12-25 ENCOUNTER — Encounter: Payer: Self-pay | Admitting: Cardiology

## 2021-12-25 ENCOUNTER — Ambulatory Visit (INDEPENDENT_AMBULATORY_CARE_PROVIDER_SITE_OTHER): Payer: BC Managed Care – PPO | Admitting: Cardiology

## 2021-12-25 VITALS — BP 108/70 | HR 75 | Temp 98.2°F | Ht 73.0 in | Wt 243.0 lb

## 2021-12-25 DIAGNOSIS — R0602 Shortness of breath: Secondary | ICD-10-CM | POA: Diagnosis not present

## 2021-12-25 NOTE — Patient Instructions (Signed)
Medication Instructions:  ? ?Your physician recommends that you continue on your current medications as directed. Please refer to the Current Medication list given to you today. ? ?*If you need a refill on your cardiac medications before your next appointment, please call your pharmacy* ? ? ?Lab Work: ?  ?Hemaglobin and Hematocrit to be drawn in office today. ? ?If you have labs (blood work) drawn today and your tests are completely normal, you will receive your results only by: ?MyChart Message (if you have MyChart) OR ?A paper copy in the mail ?If you have any lab test that is abnormal or we need to change your treatment, we will call you to review the results. ? ? ?Testing/Procedures: ? ?Someone will call you to schedule your Cardiac MRI.  Please arrive at the [facility] main entrance of [facility] 30-45 minutes prior to test start time ?. ?  ?[facility]  ?[address]  ?[place], [place] [address]  ?[phone]  ?Proceed to the [facility] Radiology Department (First Floor).  ??  ?Magnetic resonance imaging (MRI) is a painless test that produces images of the inside of the body without using X-rays. During an MRI, strong magnets and radio waves work together in a [employer] officer, political party to form detailed images. MRI images may provide more details about a medical condition than X-rays, CT scans, and ultrasounds can provide.  ?You may be given earphones to listen for instructions.  ?You may eat a light breakfast and take your normal medications as ordered. ?If a contrast material will be used, an IV will be inserted into one of your veins. Contrast material will be injected into your IV.  ?You will be asked to remove all metal, including: Watch, jewelry, and other metal objects including hearing aids, hair pieces and dentures. (Braces and fillings normally are not a problem.)  ?If contrast material was used:  ?It will leave your body through your urine within a day. You may be told to drink plenty  of fluids to help flush the contrast material out of your system.  ?TEST WILL TAKE APPROXIMATELY 1 HOUR  ?PLEASE NOTIFY SCHEDULING AT LEAST 24 HOURS IN ADVANCE IF YOU ARE UNABLE TO KEEP YOUR APPOINTMENT.    ? ? ?Follow-Up: ?At [facility], you and your health needs are our priority.  As part of our continuing mission to provide you with exceptional heart care, we have created designated Provider Care Teams.  These Care Teams include your primary Cardiologist (physician) and Advanced Practice Providers (APPs -  Physician Assistants and Nurse Practitioners) who all work together to provide you with the care you need, when you need it. ? ?We recommend signing up for the patient portal called "MyChart".  Sign up information is provided on this After Visit Summary.  MyChart is used to connect with patients for Virtual Visits (Telemedicine).  Patients are able to view lab/test results, encounter notes, upcoming appointments, etc.  Non-urgent messages can be sent to your provider as well.   ?To learn more about what you can do with MyChart, go to [email or web address].   ? ?Your next appointment:   ?Follow up as needed  ? ?The format for your next appointment:   ?In Person ? ?Provider:   ?You will see one of the following Advanced Practice Providers on your designated Care Team:   ?Murray Hodgkins, NP ?Christell Faith, PA-C ?Cadence Kathlen Mody, PA-C ? ?  ? ? ?Other Instructions ? ? ?

## 2021-12-25 NOTE — Progress Notes (Signed)
Patient contacted for new pt visit.  ?

## 2021-12-25 NOTE — Progress Notes (Signed)
?Cardiology Office Note:   ? ?Date:  12/25/2021  ? ?ID:  Seth Calderon, DOB 12-23-1990, MRN [id number] ? ?PCP:  Orpah Greek, DO ?  ?[facility] Providers ?Cardiologist:  Kate Sable, MD    ? ?Referring MD: [facility], [place] ?Chief Complaint  ?Patient presents with  ? Other  ?  Patient c.o left sided chest pain, and SOB. Started spiking fevers last week and went to [place] -- PCP is concerned with myocarditis. Meds reviewed verbally with patient.   ? ? ? ? ?History of Present Illness:   ? ?Seth Calderon is a 31 y.o. male with a hx of ADHD, hypertension who presents due to shortness of breath, fevers, elevated inflammatory markers. ? ?Patient previously seen due to shortness of breath after having COVID-19 infection.  Cardiac monitor and echocardiogram was obtained which were both unrevealing.  Recently had persistent fevers, blood cultures have been unrevealing.  Pulmonary work-up also unrevealing so far.  Blood work with inflammatory markers ESR and CRP were elevated at outside facility.  Also states having an abnormal brain MRI.  Plans to follow-up with specialist at [facility] in the near future.  States having inhalation toxicity, leading to diffuse rash causing worsening shortness of breath.  Unsure if this was a viral infection. ? ? ? ?Past Medical History:  ?Diagnosis Date  ? Hypertension   ? ? ?Past Surgical History:  ?Procedure Laterality Date  ? APPENDECTOMY    ? ? ?Current Medications: ?Current Meds  ?Medication Sig  ? albuterol (VENTOLIN HFA) 108 (90 Base) MCG/ACT inhaler Inhale into the lungs.  ? amphetamine-dextroamphetamine (ADDERALL) 10 MG tablet Take 10 mg by mouth. With lunch  ? amphetamine-dextroamphetamine (ADDERALL) 20 MG tablet Take 20 mg by mouth daily.  ? buPROPion (WELLBUTRIN XL) 300 MG 24 hr tablet   ? chlorthalidone (HYGROTON) 25 MG tablet Take 25 mg by mouth daily.  ? Cholecalciferol (VITAMIN D3) 250 MCG (10000 UT) TABS   ? clindamycin  (CLEOCIN T) 1 % external solution Apply to affected areas scalp 1-2 times a day as needed.  ? clonazePAM (KLONOPIN) 1 MG tablet Take 1 mg by mouth daily as needed.  ? fluocinonide (LIDEX) 0.05 % external solution Spot treat affected areas scalp 1-2 times a day as needed for flares.  ? Glycopyrronium Tosylate (QBREXZA) 2.4 % PADS Wipe one pad to each underarm every evening after showering.  ? guanFACINE (INTUNIV) 1 MG TB24 ER tablet Take 1 mg by mouth daily.  ? guanFACINE (INTUNIV) 4 MG TB24 ER tablet Take 4 mg by mouth daily.  ? Hyoscyamine Sulfate SL 0.125 MG SUBL Place under the tongue.  ? ibuprofen (ADVIL) 800 MG tablet Take 800 mg by mouth every 8 (eight) hours as needed.  ? ketoconazole (NIZORAL) 2 % cream Apply to affected areas body 1-2 times a day prn flares of tinea versicolor.  ? ketoconazole (NIZORAL) 2 % shampoo Massage into scalp, let sit several minutes before rinsing. May also use as a body was for tinea versicolor.  ? losartan (COZAAR) 25 MG tablet Take 25 mg by mouth daily.  ? methocarbamol (ROBAXIN) 500 MG tablet Take 500 mg by mouth 4 (four) times daily.  ? mometasone-formoterol (DULERA) 200-5 MCG/ACT AERO Inhale 2 puffs into the lungs 2 (two) times daily.  ? mupirocin ointment (BACTROBAN) 2 % Apply to open areas on body 1-2 times a day until healed. Cover with bandage and avoid picking.  ? oxybutynin (DITROPAN) 5 MG tablet 1 tab tid  prn bladder spasm  ? tamsulosin (FLOMAX) 0.4 MG CAPS capsule Take 1 capsule (0.4 mg total) by mouth 2 (two) times daily.  ?  ? ?Allergies:   Latex and Cefaclor  ? ?Social History  ? ?Socioeconomic History  ? Marital status: Married  ?  Spouse name: Not on file  ? Number of children: Not on file  ? Years of education: Not on file  ? Highest education level: Not on file  ?Occupational History  ? Not on file  ?Tobacco Use  ? Smoking status: Never  ?  Passive exposure: Never  ? Smokeless tobacco: Never  ?Vaping Use  ? Vaping Use: Never used  ?Substance and Sexual Activity   ? Alcohol use: Never  ? Drug use: Never  ? Sexual activity: Not on file  ?Other Topics Concern  ? Not on file  ?Social History Narrative  ? Not on file  ? ?Social Determinants of Health  ? ?Financial Resource Strain: Not on file  ?Food Insecurity: Not on file  ?Transportation Needs: Not on file  ?Physical Activity: Not on file  ?Stress: Not on file  ?Social Connections: Not on file  ?  ? ?Family History: ?The patient's family history is not on file. ? ?ROS:   ?Please see the history of present illness.    ? All other systems reviewed and are negative. ? ?EKGs/Labs/Other Studies Reviewed:   ? ?The following studies were reviewed today: ? ? ?EKG:  EKG is  ordered today.  The ekg ordered today demonstrates normal sinus rhythm, normal ECG. ? ?Recent Labs: ?11/03/2021: ALT 27; BUN 20; Creatinine, Ser 1.09; Hemoglobin 14.7; Platelets 443; Potassium 3.3; Sodium 134  ?Recent Lipid Panel ?No results found for: CHOL, TRIG, HDL, CHOLHDL, VLDL, LDLCALC, LDLDIRECT ? ? ?Risk Assessment/Calculations:   ? ? ?    ? ?Physical Exam:   ? ?VS:  BP 108/70 (BP Location: Left Arm, Patient Position: Sitting, Cuff Size: Normal)   Pulse 75   Temp 98.2 ?F (36.8 ?C)   Ht '6\' 1"'  (1.854 m)   Wt 243 lb (110.2 kg)   BMI 32.06 kg/m?    ? ?Wt Readings from Last 3 Encounters:  ?12/25/21 243 lb (110.2 kg)  ?12/07/21 248 lb (112.5 kg)  ?11/03/21 253 lb (114.8 kg)  ?  ? ?GEN:  Well nourished, well developed in no acute distress ?HEENT: Normal ?NECK: No JVD; No carotid bruits ?CARDIAC: RRR, no murmurs, rubs, gallops ?RESPIRATORY:  Clear to auscultation without rales, wheezing or rhonchi  ?ABDOMEN: Soft, non-tender, non-distended ?MUSCULOSKELETAL:  No edema; No deformity  ?SKIN: Warm and dry ?NEUROLOGIC:  Alert and oriented x 3 ?PSYCHIATRIC:  Normal affect  ? ?ASSESSMENT:   ? ?1. Shortness of breath   ? ? ?PLAN:   ? ?In order of problems listed above: ? ? ?Shortness of breath, elevated inflammatory markers, nonspecific fevers.  Obtain cardiac MRI to  evaluate myocarditis, also evaluate any cardiac masses.  Previous echocardiogram showed normal systolic and diastolic function.  Cardiac monitor was also unrevealing. ? ?Follow-up based on cardiac MRI results. ?  ? ? ?Medication Adjustments/Labs and Tests Ordered: ?Current medicines are reviewed at length with the patient today.  Concerns regarding medicines are outlined above.  ?Orders Placed This Encounter  ?Procedures  ? MR CARDIAC MORPHOLOGY W WO CONTRAST  ? Hemoglobin and hematocrit, blood  ? EKG 12-Lead  ? ? ? ?No orders of the defined types were placed in this encounter. ? ? ? ?Patient Instructions  ?Medication Instructions:  ? ?  Your physician recommends that you continue on your current medications as directed. Please refer to the Current Medication list given to you today. ? ?*If you need a refill on your cardiac medications before your next appointment, please call your pharmacy* ? ? ?Lab Work: ?  ?Hemaglobin and Hematocrit to be drawn in office today. ? ?If you have labs (blood work) drawn today and your tests are completely normal, you will receive your results only by: ?MyChart Message (if you have MyChart) OR ?A paper copy in the mail ?If you have any lab test that is abnormal or we need to change your treatment, we will call you to review the results. ? ? ?Testing/Procedures: ? ?Someone will call you to schedule your Cardiac MRI.  Please arrive at the [facility] main entrance of [facility] 30-45 minutes prior to test start time ?. ?  ?[facility]  ?[address]  ?[place], [place] [address]  ?[phone]  ?Proceed to the [facility] Radiology Department (First Floor).  ??  ?Magnetic resonance imaging (MRI) is a painless test that produces images of the inside of the body without using X-rays. During an MRI, strong magnets and radio waves work together in a Research officer, political party to form detailed images. MRI images may provide more details about a medical condition than X-rays, CT  scans, and ultrasounds can provide.  ?You may be given earphones to listen for instructions.  ?You may eat a light breakfast and take your normal medications as ordered. ?If a contrast material will be used, an

## 2021-12-25 NOTE — Addendum Note (Signed)
Addended by: Raelene Bott, Imagene Boss L on: 12/25/2021 12:47 PM ? ? Modules accepted: Orders ? ?

## 2021-12-28 ENCOUNTER — Ambulatory Visit: Payer: BC Managed Care – PPO | Attending: Otolaryngology

## 2021-12-28 ENCOUNTER — Inpatient Hospital Stay: Payer: BC Managed Care – PPO

## 2021-12-28 ENCOUNTER — Other Ambulatory Visit: Payer: Self-pay

## 2021-12-28 ENCOUNTER — Encounter: Payer: Self-pay | Admitting: Oncology

## 2021-12-28 ENCOUNTER — Inpatient Hospital Stay: Payer: BC Managed Care – PPO | Attending: Oncology | Admitting: Oncology

## 2021-12-28 VITALS — BP 135/88 | HR 93 | Temp 97.8°F | Wt 243.8 lb

## 2021-12-28 DIAGNOSIS — Z8371 Family history of colonic polyps: Secondary | ICD-10-CM | POA: Diagnosis not present

## 2021-12-28 DIAGNOSIS — D75839 Thrombocytosis, unspecified: Secondary | ICD-10-CM | POA: Diagnosis not present

## 2021-12-28 DIAGNOSIS — G4733 Obstructive sleep apnea (adult) (pediatric): Secondary | ICD-10-CM | POA: Insufficient documentation

## 2021-12-28 DIAGNOSIS — E669 Obesity, unspecified: Secondary | ICD-10-CM

## 2021-12-28 DIAGNOSIS — F419 Anxiety disorder, unspecified: Secondary | ICD-10-CM | POA: Insufficient documentation

## 2021-12-28 DIAGNOSIS — D72829 Elevated white blood cell count, unspecified: Secondary | ICD-10-CM | POA: Diagnosis present

## 2021-12-28 DIAGNOSIS — Z79899 Other long term (current) drug therapy: Secondary | ICD-10-CM | POA: Diagnosis not present

## 2021-12-28 DIAGNOSIS — I491 Atrial premature depolarization: Secondary | ICD-10-CM | POA: Diagnosis not present

## 2021-12-28 DIAGNOSIS — I10 Essential (primary) hypertension: Secondary | ICD-10-CM | POA: Diagnosis not present

## 2021-12-28 DIAGNOSIS — E66811 Obesity, class 1: Secondary | ICD-10-CM

## 2021-12-28 DIAGNOSIS — Z800 Family history of malignant neoplasm of digestive organs: Secondary | ICD-10-CM | POA: Diagnosis not present

## 2021-12-28 DIAGNOSIS — R509 Fever, unspecified: Secondary | ICD-10-CM

## 2021-12-28 DIAGNOSIS — Z6832 Body mass index (BMI) 32.0-32.9, adult: Secondary | ICD-10-CM | POA: Diagnosis not present

## 2021-12-28 DIAGNOSIS — Z9049 Acquired absence of other specified parts of digestive tract: Secondary | ICD-10-CM

## 2021-12-28 DIAGNOSIS — Z807 Family history of other malignant neoplasms of lymphoid, hematopoietic and related tissues: Secondary | ICD-10-CM | POA: Diagnosis not present

## 2021-12-28 DIAGNOSIS — Z8042 Family history of malignant neoplasm of prostate: Secondary | ICD-10-CM | POA: Diagnosis not present

## 2021-12-28 DIAGNOSIS — K1379 Other lesions of oral mucosa: Secondary | ICD-10-CM

## 2021-12-28 DIAGNOSIS — F319 Bipolar disorder, unspecified: Secondary | ICD-10-CM | POA: Diagnosis not present

## 2021-12-28 DIAGNOSIS — R197 Diarrhea, unspecified: Secondary | ICD-10-CM

## 2021-12-28 LAB — COMPREHENSIVE METABOLIC PANEL
ALT: 45 U/L — ABNORMAL HIGH (ref 0–44)
AST: 22 U/L (ref 15–41)
Albumin: 4.9 g/dL (ref 3.5–5.0)
Alkaline Phosphatase: 61 U/L (ref 38–126)
Anion gap: 12 (ref 5–15)
BUN: 20 mg/dL (ref 6–20)
CO2: 25 mmol/L (ref 22–32)
Calcium: 10.2 mg/dL (ref 8.9–10.3)
Chloride: 102 mmol/L (ref 98–111)
Creatinine, Ser: 1.03 mg/dL (ref 0.61–1.24)
GFR, Estimated: >60 mL/min (ref >60)
Glucose, Bld: 98 mg/dL (ref 70–99)
Potassium: 4 mmol/L (ref 3.5–5.1)
Sodium: 139 mmol/L (ref 135–145)
Total Bilirubin: 0.2 mg/dL — ABNORMAL LOW (ref 0.3–1.2)
Total Protein: 8.5 g/dL — ABNORMAL HIGH (ref 6.5–8.1)

## 2021-12-28 LAB — RETIC PANEL
Immature Retic Fract: 17.4 % — ABNORMAL HIGH (ref 2.3–15.9)
RBC.: 4.64 MIL/uL (ref 4.22–5.81)
Retic Count, Absolute: 105.8 10*3/uL (ref 19.0–186.0)
Retic Ct Pct: 2.3 % (ref 0.4–3.1)
Reticulocyte Hemoglobin: 36.6 pg (ref >27.9)

## 2021-12-28 LAB — CBC WITH DIFFERENTIAL/PLATELET
Abs Immature Granulocytes: 0.31 10*3/uL — ABNORMAL HIGH (ref 0.00–0.07)
Basophils Absolute: 0.1 10*3/uL (ref 0.0–0.1)
Basophils Relative: 1 %
Eosinophils Absolute: 0.1 10*3/uL (ref 0.0–0.5)
Eosinophils Relative: 1 %
HCT: 41.6 % (ref 39.0–52.0)
Hemoglobin: 14.3 g/dL (ref 13.0–17.0)
Immature Granulocytes: 3 %
Lymphocytes Relative: 24 %
Lymphs Abs: 2.4 10*3/uL (ref 0.7–4.0)
MCH: 31 pg (ref 26.0–34.0)
MCHC: 34.4 g/dL (ref 30.0–36.0)
MCV: 90.2 fL (ref 80.0–100.0)
Monocytes Absolute: 0.8 10*3/uL (ref 0.1–1.0)
Monocytes Relative: 8 %
Neutro Abs: 6.2 10*3/uL (ref 1.7–7.7)
Neutrophils Relative %: 63 %
Platelets: 533 10*3/uL — ABNORMAL HIGH (ref 150–400)
RBC: 4.61 MIL/uL (ref 4.22–5.81)
RDW: 11.7 % (ref 11.5–15.5)
WBC: 9.9 10*3/uL (ref 4.0–10.5)
nRBC: 0 % (ref 0.0–0.2)

## 2021-12-28 LAB — TSH: TSH: 3.246 u[IU]/mL (ref 0.350–4.500)

## 2021-12-28 LAB — HEPATITIS PANEL, ACUTE
HCV Ab: NONREACTIVE
Hep A IgM: NONREACTIVE
Hep B C IgM: NONREACTIVE
Hepatitis B Surface Ag: NONREACTIVE

## 2021-12-28 LAB — FOLATE: Folate: 20.1 ng/mL (ref >5.9)

## 2021-12-28 LAB — HIV ANTIBODY (ROUTINE TESTING W REFLEX): HIV Screen 4th Generation wRfx: NONREACTIVE
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
## 2021-12-29 LAB — KAPPA/LAMBDA LIGHT CHAINS
Kappa free light chain: 14.5 mg/L (ref 3.3–19.4)
Kappa, lambda light chain ratio: 1.49 (ref 0.26–1.65)
Lambda free light chains: 9.7 mg/L (ref 5.7–26.3)

## 2021-12-29 LAB — PATHOLOGIST SMEAR REVIEW

## 2021-12-29 LAB — ANTINUCLEAR ANTIBODIES, IFA: ANA Ab, IFA: NEGATIVE

## 2021-12-30 ENCOUNTER — Telehealth (HOSPITAL_COMMUNITY): Payer: Self-pay | Admitting: *Deleted

## 2021-12-30 ENCOUNTER — Other Ambulatory Visit: Payer: Self-pay

## 2021-12-30 ENCOUNTER — Ambulatory Visit: Payer: BC Managed Care – PPO | Admitting: Urology

## 2021-12-30 LAB — COMP PANEL: LEUKEMIA/LYMPHOMA

## 2021-12-30 LAB — CMV DNA, QUANTITATIVE, PCR
CMV DNA Quant: NEGATIVE IU/mL
Log10 CMV Qn DNA Pl: UNDETERMINED log10 IU/mL

## 2021-12-30 LAB — EPSTEIN BARR VRS(EBV DNA BY PCR): EBV DNA QN by PCR: NEGATIVE IU/mL

## 2021-12-30 NOTE — Telephone Encounter (Signed)
Attempted to call patient regarding upcoming cardiac MRI appointment. Left message on voicemail with name and callback number  Trayce Maino RN Navigator Cardiac Imaging [facility] [phone] Office [phone] Cell  

## 2021-12-31 ENCOUNTER — Telehealth (HOSPITAL_COMMUNITY): Payer: Self-pay | Admitting: *Deleted

## 2021-12-31 ENCOUNTER — Ambulatory Visit (INDEPENDENT_AMBULATORY_CARE_PROVIDER_SITE_OTHER): Payer: BC Managed Care – PPO | Admitting: Urology

## 2021-12-31 ENCOUNTER — Encounter: Payer: Self-pay | Admitting: Urology

## 2021-12-31 VITALS — BP 141/91 | HR 69 | Ht 73.0 in | Wt 243.0 lb

## 2021-12-31 DIAGNOSIS — Z87898 Personal history of other specified conditions: Secondary | ICD-10-CM | POA: Diagnosis not present

## 2021-12-31 DIAGNOSIS — R109 Unspecified abdominal pain: Secondary | ICD-10-CM | POA: Diagnosis not present

## 2021-12-31 DIAGNOSIS — R399 Unspecified symptoms and signs involving the genitourinary system: Secondary | ICD-10-CM | POA: Diagnosis not present

## 2021-12-31 DIAGNOSIS — R868 Other abnormal findings in specimens from male genital organs: Secondary | ICD-10-CM

## 2021-12-31 LAB — URINALYSIS, COMPLETE
Bilirubin, UA: NEGATIVE
Glucose, UA: NEGATIVE
Ketones, UA: NEGATIVE
Leukocytes,UA: NEGATIVE
Nitrite, UA: NEGATIVE
Protein,UA: NEGATIVE
RBC, UA: NEGATIVE
Specific Gravity, UA: 1.01 (ref 1.005–1.030)
Urobilinogen, Ur: 0.2 mg/dL (ref 0.2–1.0)
pH, UA: 7 (ref 5.0–7.5)

## 2021-12-31 LAB — MICROSCOPIC EXAMINATION
Bacteria, UA: NONE SEEN
Epithelial Cells (non renal): NONE SEEN /hpf (ref 0–10)
RBC, Urine: NONE SEEN /hpf (ref 0–2)

## 2021-12-31 LAB — BCR-ABL1 FISH
Cells Analyzed: 200
Cells Counted: 200

## 2021-12-31 NOTE — Telephone Encounter (Signed)
Reaching out to patient to offer assistance regarding upcoming cardiac imaging study; pt verbalizes understanding of appt date/time, parking situation and where to check in, and verified current allergies; name and call back number provided for further questions should they arise ? ?Gordy Clement RN Navigator Cardiac Imaging ?[facility] ?[phone] office ?[phone] cell ? ?Patient states he's had MRIs recently without incident. ?

## 2021-12-31 NOTE — Progress Notes (Signed)
? ?12/31/2021 ?4:06 PM  ? ?Seth Calderon ?December 11, 1990 ?[id number] ? ?Referring provider: [facility], [facility] ?[address] ?[place],  [place] [address] ? ?Chief Complaint  ?Patient presents with  ? Follow-up  ? ? ?HPI: ?31 y.o. male presents for follow-up visit. ? ?Refer to my prior note 12/07/2021 ?After indwelling Foley time 7 days he did have persistent retention and was started on CIC which he performed for 10 days.  He discontinued after his residuals were less than 50 cc ?He is currently being evaluated by heme-onc for leukocytosis and by neurology ?MRI showed bulging discs in the thoracic spine but no lumbar pathology was mention ?Remains on tamsulosin though has been experiencing some orthostatic symptoms and ejaculatory dysfunction ?Over the past few days he has experienced some worsening voiding symptoms and dysuria ?He states his GFR has been worsening and is having mild right flank pain.  CT January 2023 showed no hydronephrosis ? ? ?PMH: ?Past Medical History:  ?Diagnosis Date  ? Anxiety   ? Asthma   ? Autism spectrum disorder   ? Bipolar 1 disorder ([place])   ? COVID-19 virus infection   ? History of depression   ? History of exposure to toxins via inhalation   ? History of pneumonia   ? Hypertension   ? Neuropathy   ? Sleep apnea   ? ? ?Surgical History: ?Past Surgical History:  ?Procedure Laterality Date  ? APPENDECTOMY    ? fever of unknow origin    ? open redution radius fracture    ? TONSILLECTOMY    ? ? ?Home Medications:  ?Allergies as of 12/31/2021   ? ?   Reactions  ? Latex Swelling  ? Cefaclor Other (See Comments)  ? Mom told him this when he was under 51yo.  ? ?  ? ?  ?Medication List  ?  ? ?  ? Accurate as of December 31, 2021  4:06 PM. If you have any questions, ask your nurse or doctor.  ?  ?  ? ?  ? ?STOP taking these medications   ? ?tamsulosin 0.4 MG Caps capsule ?Commonly known as: FLOMAX ?Stopped by: Abbie Sons, MD ?  ? ?  ? ?TAKE these medications   ? ?albuterol 108 (90  Base) MCG/ACT inhaler ?Commonly known as: VENTOLIN HFA ?Inhale into the lungs. ?  ?amphetamine-dextroamphetamine 20 MG tablet ?Commonly known as: ADDERALL ?Take 20 mg by mouth daily. ?  ?amphetamine-dextroamphetamine 10 MG tablet ?Commonly known as: ADDERALL ?Take 10 mg by mouth. With lunch ?  ?buPROPion 300 MG 24 hr tablet ?Commonly known as: WELLBUTRIN XL ?  ?chlorthalidone 25 MG tablet ?Commonly known as: HYGROTON ?Take 25 mg by mouth daily. ?  ?clindamycin 1 % external solution ?Commonly known as: CLEOCIN T ?Apply to affected areas scalp 1-2 times a day as needed. ?  ?clonazePAM 1 MG tablet ?Commonly known as: KLONOPIN ?Take 1 mg by mouth daily as needed. ?  ?Dulera 200-5 MCG/ACT Aero ?Generic drug: mometasone-formoterol ?Inhale 2 puffs into the lungs 2 (two) times daily. ?  ?fluocinonide 0.05 % external solution ?Commonly known as: LIDEX ?Spot treat affected areas scalp 1-2 times a day as needed for flares. ?  ?guanFACINE 4 MG Tb24 ER tablet ?Commonly known as: INTUNIV ?Take 4 mg by mouth daily. ?  ?guanFACINE 1 MG Tb24 ER tablet ?Commonly known as: INTUNIV ?Take 1 mg by mouth daily. ?  ?Hyoscyamine Sulfate SL 0.125 MG Subl ?Place under the tongue. ?  ?ibuprofen 800 MG  tablet ?Commonly known as: ADVIL ?Take 800 mg by mouth every 8 (eight) hours as needed. ?  ?ketoconazole 2 % cream ?Commonly known as: NIZORAL ?Apply to affected areas body 1-2 times a day prn flares of tinea versicolor. ?  ?ketoconazole 2 % shampoo ?Commonly known as: NIZORAL ?Massage into scalp, let sit several minutes before rinsing. May also use as a body was for tinea versicolor. ?  ?losartan 25 MG tablet ?Commonly known as: COZAAR ?Take 25 mg by mouth daily. ?  ?Melatonin 10 MG Tabs ?Take 10 mg by mouth as needed. ?  ?methocarbamol 500 MG tablet ?Commonly known as: ROBAXIN ?Take 500 mg by mouth 4 (four) times daily. ?  ?mupirocin ointment 2 % ?Commonly known as: BACTROBAN ?Apply to open areas on body 1-2 times a day until healed. Cover with  bandage and avoid picking. ?  ?ondansetron 8 MG disintegrating tablet ?Commonly known as: ZOFRAN-ODT ?Take 8 mg by mouth every 8 (eight) hours as needed for nausea or vomiting. ?  ?oxybutynin 5 MG tablet ?Commonly known as: DITROPAN ?1 tab tid prn bladder spasm ?  ?promethazine 25 MG tablet ?Commonly known as: PHENERGAN ?Take 25 mg by mouth every 6 (six) hours as needed for nausea or vomiting. ?  ?Qbrexza 2.4 % Pads ?Generic drug: Glycopyrronium Tosylate ?Wipe one pad to each underarm every evening after showering. ?  ?Vitamin D3 250 MCG (10000 UT) Tabs ?  ? ?  ? ? ?Allergies:  ?Allergies  ?Allergen Reactions  ? Latex Swelling  ? Cefaclor Other (See Comments)  ?  Mom told him this when he was under 37yo.  ? ? ?Family History: ?Family History  ?Problem Relation Age of Onset  ? Hypothyroidism Mother   ? Colon polyps Mother   ? Raynaud syndrome Mother   ? Hypothyroidism Father   ? Liver disease Father   ? Pancreatic cancer Maternal Grandmother   ? Non-Hodgkin's lymphoma Maternal Grandfather   ? Prostate cancer Paternal Grandfather   ? Pancreatic cancer Maternal Great-grandmother   ? ? ?Social History:  reports that he quit smoking about 10 years ago. His smoking use included cigarettes. He has a 0.25 pack-year smoking history. He has never been exposed to tobacco smoke. He has never used smokeless tobacco. He reports that he does not currently use alcohol. He reports that he does not use drugs. ? ? ?Physical Exam: ?BP (!) 141/91   Pulse 69   Ht '6\' 1"'$  (1.854 m)   Wt 243 lb (110.2 kg)   BMI 32.06 kg/m?   ?Constitutional:  Alert and oriented, No acute distress. ?HEENT: [place] AT, moist mucus membranes.  Trachea midline, no masses. ?Respiratory: Normal respiratory effort, no increased work of breathing. ?Psychiatric: Normal mood and affect. ? ?Laboratory Data: ? ?Urinalysis ?Dipstick/microscopy negative ? ? ?Assessment & Plan:   ? ?Lower urinary tract symptoms/history urinary retention ?Etiology unknown at this  time ?Unclear if obstructive or related to detrusor hypotonicity ?Since he is having side effects with tamsulosin will have him discontinue and he was instructed to call should he note worsening voiding symptoms and will send in Rx for an alternate alpha-blocker ?We discussed a urodynamic study could help differentiate outlet obstruction versus a detrusor etiology however he wanted to wait until his heme-onc and urology work-up is complete before deciding ?He was given additional catheters should he note increased residuals ? ?2.  Right flank pain ?Schedule RUS ? ? ?Abbie Sons, MD ? ?[facility] ?[address] ?  [place], [place] [address] ?[phone] ? ?

## 2022-01-01 ENCOUNTER — Ambulatory Visit (HOSPITAL_COMMUNITY)
Admission: RE | Admit: 2022-01-01 | Discharge: 2022-01-01 | Disposition: A | Discharge status: Home / Self Care | Payer: BC Managed Care – PPO | Source: Ambulatory Visit | Attending: Cardiology | Admitting: Cardiology

## 2022-01-01 ENCOUNTER — Encounter: Payer: Self-pay | Admitting: Oncology

## 2022-01-01 ENCOUNTER — Ambulatory Visit: Payer: BC Managed Care – PPO | Admitting: Urology

## 2022-01-01 DIAGNOSIS — R0602 Shortness of breath: Secondary | ICD-10-CM | POA: Insufficient documentation

## 2022-01-01 DIAGNOSIS — I361 Nonrheumatic tricuspid (valve) insufficiency: Secondary | ICD-10-CM

## 2022-01-01 LAB — MULTIPLE MYELOMA PANEL, SERUM
Albumin SerPl Elph-Mcnc: 4.2 g/dL (ref 2.9–4.4)
Albumin/Glob SerPl: 1.2 (ref 0.7–1.7)
Alpha 1: 0.4 g/dL (ref 0.0–0.4)
Alpha2 Glob SerPl Elph-Mcnc: 1.1 g/dL — ABNORMAL HIGH (ref 0.4–1.0)
B-Globulin SerPl Elph-Mcnc: 1.2 g/dL (ref 0.7–1.3)
Gamma Glob SerPl Elph-Mcnc: 1 g/dL (ref 0.4–1.8)
Globulin, Total: 3.7 g/dL (ref 2.2–3.9)
IgA: 205 mg/dL (ref 90–386)
IgG (Immunoglobin G), Serum: 796 mg/dL (ref 603–1613)
IgM (Immunoglobulin M), Srm: 158 mg/dL (ref 20–172)
Total Protein ELP: 7.9 g/dL (ref 6.0–8.5)

## 2022-01-01 IMAGING — MR MR CARD MORPHOLOGY WO/W CM
45 of 48 series · 45 of 48 positions shown · IV contrast (gadavist)
Comparison: none

CLINICAL DATA: Evaluate for myocarditis

EXAM:
CARDIAC MRI
TECHNIQUE: The patient was scanned on a 1.5 Tesla Siemens magnet. A dedicated
cardiac coil was used. Functional imaging was done using Fiesta
sequences. [DATE], and 4 chamber views were done to assess for RWMA's.
Modified YAHIR rule using a short axis stack was used to
calculate an ejection fraction on a dedicated work station using
Circle software. The patient received 10 cc of Gadavist. After 10
minutes inversion recovery sequences were used to assess for
infiltration and scar tissue.
CONTRAST:  10 cc  of Gadavist

[Series 4: t2_haste_db_tra_bh · axial · 8.0mm · 1.48mm/px · 1 of 16 slices shown]
[im 1/16]
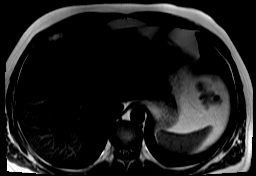

[Series 8: bSSFP · oblique · 8.0mm · 1.70mm/px · 1 of 25 slices shown (1 of 20)]
[im 1/25]
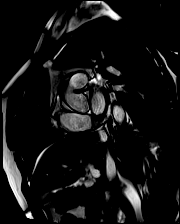

[Series 9: bSSFP · oblique · 8.0mm · 1.70mm/px · 1 of 25 slices shown (2 of 20)]
[im 1/25]
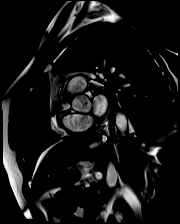

[Series 10: bSSFP · oblique · 8.0mm · 1.70mm/px · 1 of 25 slices shown (3 of 20)]
[im 1/25]
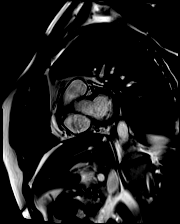

[Series 11: bSSFP · oblique · 8.0mm · 1.70mm/px · 1 of 25 slices shown (4 of 20)]
[im 1/25]
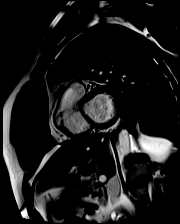

[Series 12: bSSFP · oblique · 8.0mm · 1.70mm/px · 1 of 25 slices shown (5 of 20)]
[im 1/25]
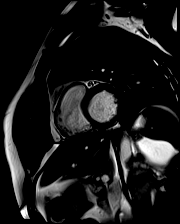

[Series 13: bSSFP · oblique · 8.0mm · 1.70mm/px · 1 of 25 slices shown (6 of 20)]
[im 1/25]
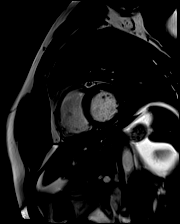

[Series 14: bSSFP · oblique · 8.0mm · 1.70mm/px · 1 of 25 slices shown (7 of 20)]
[im 1/25]
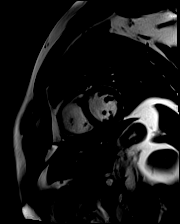

[Series 15: bSSFP · oblique · 8.0mm · 1.70mm/px · 1 of 25 slices shown (8 of 20)]
[im 1/25]
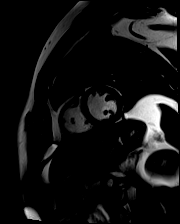

[Series 16: bSSFP · oblique · 8.0mm · 1.70mm/px · 1 of 25 slices shown (9 of 20)]
[im 1/25]
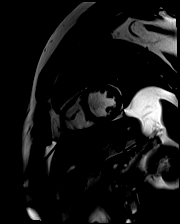

[Series 17: bSSFP · oblique · 8.0mm · 1.70mm/px · 1 of 25 slices shown (10 of 20)]
[im 1/25]
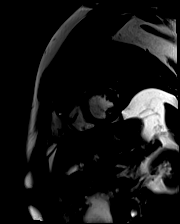

[Series 18: bSSFP · oblique · 8.0mm · 1.70mm/px · 1 of 25 slices shown (11 of 20)]
[im 1/25]
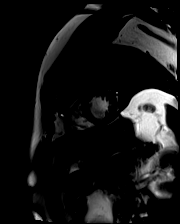

[Series 19: bSSFP · oblique · 8.0mm · 1.70mm/px · 1 of 25 slices shown (12 of 20)]
[im 1/25]
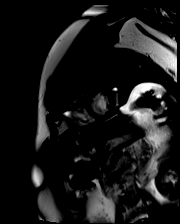

[Series 20: bSSFP · oblique · 8.0mm · 1.70mm/px · 1 of 25 slices shown (13 of 20)]
[im 1/25]
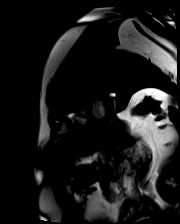

[Series 21: bSSFP · oblique · 8.0mm · 1.70mm/px · 1 of 25 slices shown (14 of 20)]
[im 1/25]
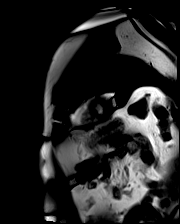

[Series 22: bSSFP · oblique · 8.0mm · 1.70mm/px · 1 of 25 slices shown (15 of 20)]
[im 1/25]
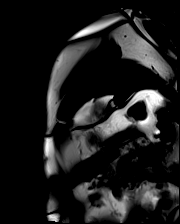

[Series 23: bSSFP · oblique · 8.0mm · 1.70mm/px · 1 of 25 slices shown (16 of 20)]
[im 1/25]
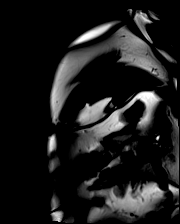

[Series 24: bSSFP · axial · 6.0mm · 1.41mm/px · 1 of 25 slices shown (17 of 20)]
[im 1/25]
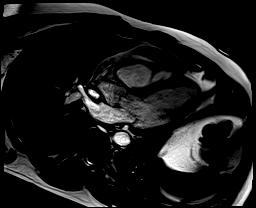

[Series 25: bSSFP · oblique · 6.0mm · 1.41mm/px · 1 of 25 slices shown (18 of 20)]
[im 1/25]
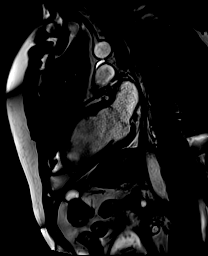

[Series 26: bSSFP · axial · 6.0mm · 1.41mm/px · 1 of 25 slices shown (19 of 20)]
[im 1/25]
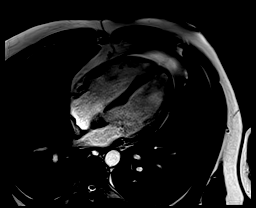

[Series 27: STIR · oblique · 8.0mm · 1.92mm/px · 1 of 17 slices shown]
[im 1/17]
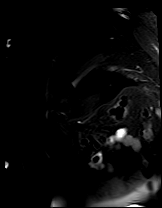

[Series 28: bSSFP · axial · 6.0mm · 1.41mm/px · 1 of 25 slices shown (20 of 20)]
[im 1/25]
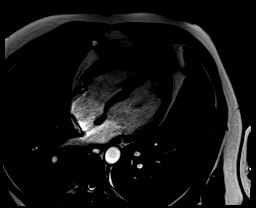

[Series 29: cine_trufi_short axis_cs_2_shot · oblique · 8.0mm · 1.48mm/px · 1 of 25 slices shown (1 of 18)]
[im 1/25]
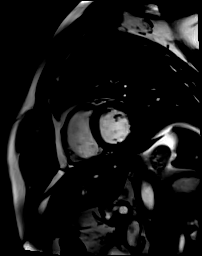

[Series 29: cine_trufi_short axis_cs_2_shot · oblique · 8.0mm · 1.48mm/px · 1 of 25 slices shown (2 of 18)]
[im 1/25]
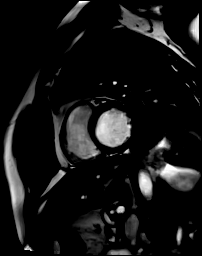

[Series 29: cine_trufi_short axis_cs_2_shot · oblique · 8.0mm · 1.48mm/px · 1 of 25 slices shown (3 of 18)]
[im 1/25]
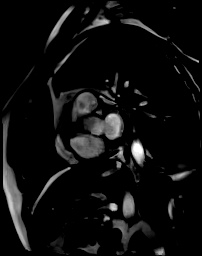

[Series 29: cine_trufi_short axis_cs_2_shot · oblique · 8.0mm · 1.48mm/px · 1 of 25 slices shown (4 of 18)]
[im 1/25]
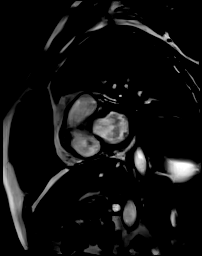

[Series 29: cine_trufi_short axis_cs_2_shot · oblique · 8.0mm · 1.48mm/px · 1 of 25 slices shown (5 of 18)]
[im 1/25]
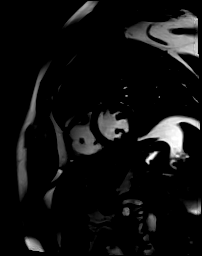

[Series 29: cine_trufi_short axis_cs_2_shot · oblique · 8.0mm · 1.48mm/px · 1 of 25 slices shown (6 of 18)]
[im 1/25]
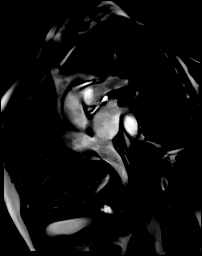

[Series 29: cine_trufi_short axis_cs_2_shot · oblique · 8.0mm · 1.48mm/px · 1 of 25 slices shown (7 of 18)]
[im 1/25]
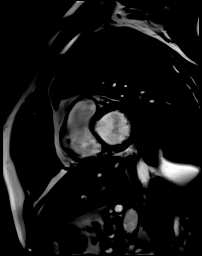

[Series 29: cine_trufi_short axis_cs_2_shot · oblique · 8.0mm · 1.48mm/px · 1 of 25 slices shown (8 of 18)]
[im 1/25]
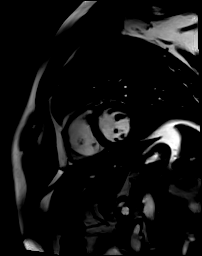

[Series 29: cine_trufi_short axis_cs_2_shot · oblique · 8.0mm · 1.48mm/px · 1 of 25 slices shown (9 of 18)]
[im 1/25]
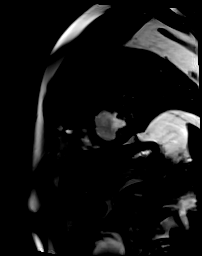

[Series 29: cine_trufi_short axis_cs_2_shot · oblique · 8.0mm · 1.48mm/px · 1 of 25 slices shown (10 of 18)]
[im 1/25]
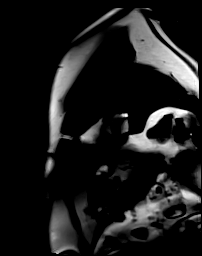

[Series 29: cine_trufi_short axis_cs_2_shot · oblique · 8.0mm · 1.48mm/px · 1 of 25 slices shown (11 of 18)]
[im 1/25]
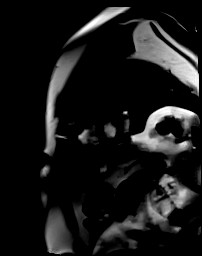

[Series 29: cine_trufi_short axis_cs_2_shot · oblique · 8.0mm · 1.48mm/px · 1 of 25 slices shown (12 of 18)]
[im 1/25]
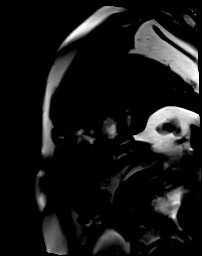

[Series 29: cine_trufi_short axis_cs_2_shot · oblique · 8.0mm · 1.48mm/px · 1 of 25 slices shown (13 of 18)]
[im 1/25]
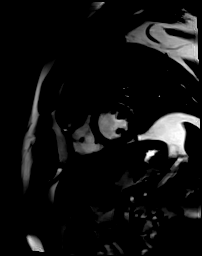

[Series 29: cine_trufi_short axis_cs_2_shot · oblique · 8.0mm · 1.48mm/px · 1 of 25 slices shown (14 of 18)]
[im 1/25]
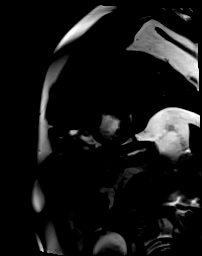

[Series 29: cine_trufi_short axis_cs_2_shot · oblique · 8.0mm · 1.48mm/px · 1 of 25 slices shown (15 of 18)]
[im 1/25]
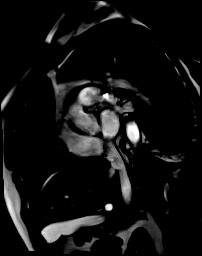

[Series 29: cine_trufi_short axis_cs_2_shot · oblique · 8.0mm · 1.48mm/px · 1 of 25 slices shown (16 of 18)]
[im 1/25]
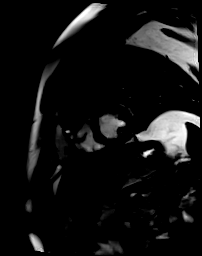

[Series 29: cine_trufi_short axis_cs_2_shot · oblique · 8.0mm · 1.48mm/px · 1 of 25 slices shown (17 of 18)]
[im 1/25]
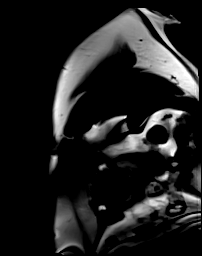

[Series 29: cine_trufi_short axis_cs_2_shot · oblique · 8.0mm · 1.48mm/px · 1 of 25 slices shown (18 of 18)]
[im 1/25]
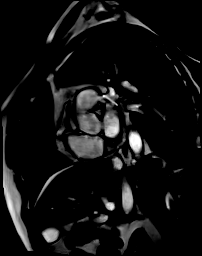

[Series 30: (id)_long_t1 · oblique · 8.0mm · 1.56mm/px · 1 of 24 slices shown]
[im 1/24]
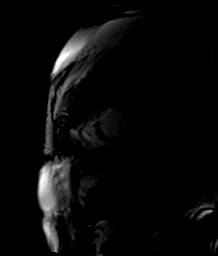

[Series 31: (id)_long_t1_moco · oblique · 8.0mm · 1.56mm/px · 1 of 24 slices shown]
[im 1/24]
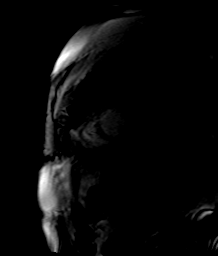

[Series 32: (id)_long_t1_moco_t1 · oblique · 8.0mm · 1.56mm/px · 1 of 3 slices shown (1 of 2)]
[im 1/3]
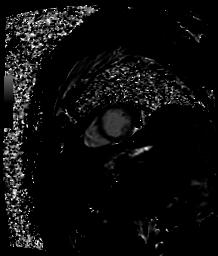

[Series 32: (id)_long_t1_moco_t1 · 1 of 3 slices shown (2 of 2)]
[im 1/3]
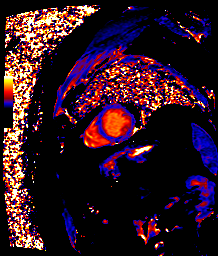

[Series 34: (id)_trufi · oblique · 8.0mm · 2.08mm/px · 1 of 9 slices shown]
[im 1/9]
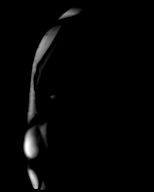

[45 of 48 positions shown; findings below may reference images not displayed]

FINDINGS: 1. Normal left ventricular size, thickness and systolic function
(LVEF =46%). There are no regional wall motion abnormalities.

There is no late gadolinium enhancement in the left ventricular
myocardium.

LV myocardium T1 = [Y0] ms

LVEDV: 175 ml

LVESV: 95 ml

SV: 80.9 ml

CO: 5.7L/min

Myocardial mass: 112 ml

2. Normal right ventricular size, thickness and systolic function
(RVEF = 42%). There are no regional wall motion abnormalities.

3.  Normal left and right atrial size.

4. Normal size of the aortic root, ascending aorta and pulmonary
artery.

5.  Mild tricuspid regurgitation

6.  Normal pericardium.  No pericardial effusion.
IMPRESSION: 1.  Normal LV size and function.

2.  No evidence of late gadolinium enhancement or scar.

3.  Normal LV T1 value.

4.  Mild tricuspid regurgitation.

5.  No evidence of myocarditis or LV mass.

## 2022-01-01 MED ORDER — GADOBUTROL 1 MMOL/ML IV SOLN
10.0000 mL | Freq: Once | INTRAVENOUS | Status: AC | PRN
  Administered 2022-01-01: 10 mL via INTRAVENOUS

## 2022-01-05 ENCOUNTER — Encounter: Payer: Self-pay | Admitting: Oncology

## 2022-01-05 ENCOUNTER — Encounter: Payer: BC Managed Care – PPO | Admitting: Physical Therapy

## 2022-01-08 ENCOUNTER — Encounter: Payer: BC Managed Care – PPO | Admitting: Physical Therapy

## 2022-01-11 ENCOUNTER — Telehealth: Payer: Self-pay | Admitting: *Deleted

## 2022-01-11 DIAGNOSIS — R109 Unspecified abdominal pain: Secondary | ICD-10-CM

## 2022-01-11 LAB — JAK2  V617F QUAL. WITH REFLEX TO EXON 12

## 2022-01-11 LAB — MISC LABCORP TEST (SEND OUT): Labcorp test code: 489540

## 2022-01-11 NOTE — Telephone Encounter (Signed)
Patient left a message stating that he wanted to get Schedule RUS.  ?

## 2022-01-12 ENCOUNTER — Encounter: Payer: Self-pay | Admitting: Urology

## 2022-01-12 ENCOUNTER — Encounter: Payer: BC Managed Care – PPO | Admitting: Physical Therapy

## 2022-01-12 NOTE — Telephone Encounter (Signed)
Error

## 2022-01-13 ENCOUNTER — Other Ambulatory Visit: Payer: Self-pay

## 2022-01-13 ENCOUNTER — Telehealth: Payer: Self-pay | Admitting: *Deleted

## 2022-01-13 NOTE — Telephone Encounter (Signed)
Patient called stating that lab results are back and he wants to go ahead and get BMBx done this week and not wait until his follow up for it to be set up. He states he has to be at [facility] and and [facility] this week. He states that he has sent several messages about this and has not gotten a response back form them. Please return his call. ?

## 2022-01-13 NOTE — Addendum Note (Signed)
Addended by: Abbie Sons on: 01/13/2022 01:28 AM ? ? Modules accepted: Orders ? ?

## 2022-01-13 NOTE — Telephone Encounter (Signed)
Ultrasound order entered.  Will contact with results ?

## 2022-01-13 NOTE — Telephone Encounter (Signed)
Spoke with patient and advised that Dr. Tasia Catchings viewed lab results and doesn't feel patient needs BMB done at this time. Per Dr. Tasia Catchings will further discuss this at his appt on Monday, 4/17. Patient verbalized he doesn't agree but is agreement to further discuss on Monday.  ?

## 2022-01-15 ENCOUNTER — Ambulatory Visit: Payer: BC Managed Care – PPO | Admitting: Physical Therapy

## 2022-01-16 ENCOUNTER — Encounter: Payer: Self-pay | Admitting: Urology

## 2022-01-18 ENCOUNTER — Encounter: Payer: Self-pay | Admitting: Oncology

## 2022-01-18 ENCOUNTER — Encounter: Payer: BC Managed Care – PPO | Admitting: Physical Therapy

## 2022-01-18 ENCOUNTER — Inpatient Hospital Stay: Payer: BC Managed Care – PPO | Attending: Oncology | Admitting: Oncology

## 2022-01-18 VITALS — BP 121/75 | HR 69 | Temp 97.7°F | Resp 16 | Ht 73.0 in | Wt 248.0 lb

## 2022-01-18 DIAGNOSIS — D72829 Elevated white blood cell count, unspecified: Secondary | ICD-10-CM | POA: Insufficient documentation

## 2022-01-18 DIAGNOSIS — K921 Melena: Secondary | ICD-10-CM | POA: Insufficient documentation

## 2022-01-18 DIAGNOSIS — Z79899 Other long term (current) drug therapy: Secondary | ICD-10-CM | POA: Diagnosis not present

## 2022-01-18 DIAGNOSIS — Z9049 Acquired absence of other specified parts of digestive tract: Secondary | ICD-10-CM | POA: Diagnosis not present

## 2022-01-18 DIAGNOSIS — D75839 Thrombocytosis, unspecified: Secondary | ICD-10-CM | POA: Diagnosis not present
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
## 2022-01-19 ENCOUNTER — Encounter: Payer: Self-pay | Admitting: Dermatology

## 2022-01-19 ENCOUNTER — Ambulatory Visit (INDEPENDENT_AMBULATORY_CARE_PROVIDER_SITE_OTHER): Payer: BC Managed Care – PPO | Admitting: Dermatology

## 2022-01-19 ENCOUNTER — Telehealth: Payer: Self-pay | Admitting: Emergency Medicine

## 2022-01-19 DIAGNOSIS — D229 Melanocytic nevi, unspecified: Secondary | ICD-10-CM | POA: Diagnosis not present

## 2022-01-19 DIAGNOSIS — R21 Rash and other nonspecific skin eruption: Secondary | ICD-10-CM

## 2022-01-19 DIAGNOSIS — S40812A Abrasion of left upper arm, initial encounter: Secondary | ICD-10-CM

## 2022-01-19 DIAGNOSIS — D692 Other nonthrombocytopenic purpura: Secondary | ICD-10-CM | POA: Diagnosis not present

## 2022-01-19 DIAGNOSIS — T148XXA Other injury of unspecified body region, initial encounter: Secondary | ICD-10-CM

## 2022-01-19 NOTE — Patient Instructions (Signed)
Gentle Skin Care Guide ? ?1. Bathe no more than once a day. ? ?2. Avoid bathing in hot water ? ?3. Use a mild soap like Dove, Vanicream, Cetaphil, CeraVe. Can use Lever 2000 or Cetaphil antibacterial soap ? ?4. Use soap only where you need it. On most days, use it under your arms, between your legs, and on your feet. Let the water rinse other areas unless visibly dirty. ? ?5. When you get out of the bath/shower, use a towel to gently blot your skin dry, don't rub it. ? ?6. While your skin is still a little damp, apply a moisturizing cream such as Vanicream, CeraVe, Cetaphil, Eucerin, Sarna lotion or plain Vaseline Jelly. For hands apply Neutrogena [place] Hand Cream or Excipial Hand Cream. ? ?7. Reapply moisturizer any time you start to itch or feel dry. ? ?8. Sometimes using free and clear laundry detergents can be helpful. Fabric softener sheets should be avoided. Downy Free & Gentle liquid, or any liquid fabric softener that is free of dyes and perfumes, it acceptable to use ? ?9. If your doctor has given you prescription creams you may apply moisturizers over them  ? ? ? ? ?If You Need Anything After Your Visit ? ?If you have any questions or concerns for your doctor, please call our main line at [phone] and press option 4 to reach your doctor's medical assistant. If no one answers, please leave a voicemail as directed and we will return your call as soon as possible. Messages left after 4 pm will be answered the following business day.  ? ?You may also send [place] a message via MyChart. We typically respond to MyChart messages within 1-2 business days. ? ?For prescription refills, please ask your pharmacy to contact our office. Our fax number is [phone]. ? ?If you have an urgent issue when the clinic is closed that cannot wait until the next business day, you can page your doctor at the number below.   ? ?Please note that while we do our best to be available for urgent issues outside of office hours, we  are not available 24/7.  ? ?If you have an urgent issue and are unable to reach [place], you may choose to seek medical care at your doctor's office, retail clinic, urgent care center, or emergency room. ? ?If you have a medical emergency, please immediately call 911 or go to the emergency department. ? ?Pager Numbers ? ?- Dr. Nehemiah Massed: [phone] ? ?- Dr. Laurence Ferrari: [phone] ? ?- Dr. Nicole Kindred: [phone] ? ?In the event of inclement weather, please call our main line at [phone] for an update on the status of any delays or closures. ? ?Dermatology Medication Tips: ?Please keep the boxes that topical medications come in in order to help keep track of the instructions about where and how to use these. Pharmacies typically print the medication instructions only on the boxes and not directly on the medication tubes.  ? ?If your medication is too expensive, please contact our office at [phone] option 4 or send [place] a message through [place].  ? ?We are unable to tell what your co-pay for medications will be in advance as this is different depending on your insurance coverage. However, we may be able to find a substitute medication at lower cost or fill out paperwork to get insurance to cover a needed medication.  ? ?If a prior authorization is required to get your medication covered by your insurance company, please allow [place] 1-2 business days to  complete this process. ? ?Drug prices often vary depending on where the prescription is filled and some pharmacies may offer cheaper prices. ? ?The website www.goodrx.com contains coupons for medications through different pharmacies. The prices here do not account for what the cost may be with help from insurance (it may be cheaper with your insurance), but the website can give you the price if you did not use any insurance.  ?- You can print the associated coupon and take it with your prescription to the pharmacy.  ?- You may also stop by our office during regular business  hours and pick up a GoodRx coupon card.  ?- If you need your prescription sent electronically to a different pharmacy, notify our office through [facility] or by phone at [phone] option 4. ? ? ? ? ?Si Usted Necesita Algo Despu?s de Su Visita ? ?Tambi?n puede enviarnos un mensaje a trav?s de MyChart. Por lo general respondemos a los mensajes de MyChart en el transcurso de 1 a 2 d?as h?biles. ? ?Para renovar recetas, por favor pida a su farmacia que se ponga en contacto con nuestra oficina. Nuestro n?mero de fax es el [phone]. ? ?Si tiene un asunto urgente cuando la cl?nica est? cerrada y que no puede esperar hasta el siguiente d?a h?bil, puede llamar/localizar a su doctor(a) al n?mero que aparece a continuaci?n.  ? ?Por favor, tenga en cuenta que aunque hacemos todo lo posible para estar disponibles para asuntos urgentes fuera del horario de oficina, no estamos disponibles las 24 horas del d?a, los 7 d?as de la semana.  ? ?Si tiene un problema urgente y no puede comunicarse con nosotros, puede optar por buscar atenci?n m?dica  en el consultorio de su doctor(a), en una cl?nica privada, en un centro de atenci?n urgente o en una sala de emergencias. ? ?Si tiene Engineer, maintenance (IT) m?dica, por favor llame inmediatamente al 911 o vaya a la sala de emergencias. ? ?N?meros de b?per ? ?- Dr. Nehemiah Massed: [phone] ? ?- Dra. Moye: [phone] ? ?- Dra. Nicole Kindred: [phone] ? ?En caso de inclemencias del tiempo, por favor llame a nuestra l?nea principal al [phone] para una actualizaci?n sobre el estado de cualquier retraso o cierre. ? ?Consejos para la medicaci?n en dermatolog?a: ?Por favor, guarde las cajas en las que vienen los medicamentos de uso t?pico para ayudarle a seguir las instrucciones sobre d?nde y c?mo usarlos. Las farmacias generalmente imprimen las instrucciones del medicamento s?lo en las cajas y no directamente en los tubos del [place].  ? ?Si su medicamento es muy caro, por favor,  p?ngase en contacto con Zigmund Daniel llamando al [phone] y presione la opci?n 4 o env?enos un mensaje a trav?s de MyChart.  ? ?No podemos decirle cu?l ser? su copago por los medicamentos por adelantado ya que esto es diferente dependiendo de la cobertura de su seguro. Sin embargo, es posible que podamos encontrar un medicamento sustituto a Electrical engineer un formulario para que el seguro cubra el medicamento que se considera necesario.  ? ?Si se requiere Ardelia Mems autorizaci?n previa para que su compa??a de seguros [place] su medicamento, por favor perm?tanos de 1 a 2 d?as h?biles para completar este proceso. ? ?Los precios de los medicamentos var?an con frecuencia dependiendo del Environmental consultant de d?nde se surte la receta y alguna farmacias pueden ofrecer precios m?s baratos. ? ?El sitio web www.goodrx.com tiene cupones para medicamentos de Airline pilot. Los precios aqu? no tienen en cuenta lo que podr?a costar con la ayuda del  seguro (puede ser m?s barato con su seguro), pero el sitio web puede darle el precio si no utiliz? ning?n seguro.  ?- Puede imprimir el cup?n correspondiente y llevarlo con su receta a la farmacia.  ?- Tambi?n puede pasar por nuestra oficina durante el horario de atenci?n regular y recoger una tarjeta de cupones de GoodRx.  ?- Si necesita que su receta se env?e electr?nicamente a Chiropodist, informe a nuestra oficina a trav?s de MyChart de [place] o por tel?fono llamando al [phone] y presione la opci?n 4.  ?

## 2022-01-19 NOTE — Telephone Encounter (Signed)
Referral to Dr Posey Pronto (Rheumatology) faxed. Confirmation fax received.  ?

## 2022-01-19 NOTE — Progress Notes (Signed)
? ?  Follow-Up Visit ?  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 He is being followed by oncology, cardiology, hematology and neurology and has not gotten any diagnoses. He states he has noticed lesions popping up in random body areas. He tries to not scratch them. They do bleed easily and seem to never heal. Denies itching or burning of these lesions). No new lesions present today. ?C/O generalized weakness, joint pain, neuropathy. H/O appendectomy in December 2022.  ?H/O inhalation of unknown aerosolized powder.   ? ?The following portions of the chart were reviewed this encounter and updated as appropriate:   ?  ? ?Review of Systems: No other skin or systemic complaints except as noted in HPI or Assessment and Plan. ? ? ?Objective  ?Well appearing patient in no apparent distress; mood and affect are within normal limits. ? ?A focused examination was performed including face, arms, torso, legs, feet. Relevant physical exam findings are noted in the Assessment and Plan. ? ?Right Abdomen, L foot, R arm ?Violaceous macules at right foot dorsum x2, right forearm, right abdomen ? ?Left Forearm - Ventral ?Small light pink heme crusted papule c/w healing excoriation ? ? ?Assessment & Plan  ? ?Melanocytic Nevi ?- Tan-brown and/or pink-flesh-colored symmetric macules and papules ?- Benign appearing on exam today ?- Observation ?- Call clinic for new or changing moles ?- Recommend daily use of broad spectrum spf 30+ sunscreen to sun-exposed areas.   ? ?Purpura - Chronic; persistent and recurrent.  Treatable, but not curable. ?- Violaceous macules and patches at arms and legs ?- Benign ?- Related to trauma, age, sun damage and/or use of blood thinners, chronic use of topical and/or oral steroids ?- Observe, pt has had negative work-up by hematology ?-  Can use OTC arnica containing moisturizer such as Dermend Bruise Formula if desired ?- Call for worsening or other concerns ? ?Rash ?Right Abdomen, L foot, R arm ? ?Post-inflammatory hyperpigmentation, no primary lesions today. Hx of insect bites in the past. No known h/o tick bite or annular rash. ? ?Will R/O lyme disease due to presence of multiple chronic nondescript systemic symptoms.  ? ?RTC when see new lesions. May perform testing (Biopsy, Culture) ? ?Related Procedures ?Lyme Disease Serology w/Reflex ? ?Excoriation ?Left Forearm - Ventral ? ?Healing. Benign, observe.  ? ?Related Medications ?mupirocin ointment (BACTROBAN) 2 % ?Apply to open areas on body 1-2 times a day until healed. Cover with bandage and avoid picking. ? ? ?Return if symptoms worsen or fail to improve. ? ?I, Emelia Salisbury, CMA, am acting as scribe for Brendolyn Patty, MD. ? ?Documentation: I have reviewed the above documentation for accuracy and completeness, and I agree with the above. ? ?Brendolyn Patty MD  ? ? ?

## 2022-01-22 ENCOUNTER — Telehealth: Payer: Self-pay | Admitting: *Deleted

## 2022-01-22 ENCOUNTER — Encounter: Payer: Self-pay | Admitting: Oncology

## 2022-01-22 NOTE — Telephone Encounter (Signed)
Patient called requesting to be scheduled for BMBx "in light of most recent lab results" ?

## 2022-01-22 NOTE — Telephone Encounter (Signed)
Pt also sent a Mychart message earlier, forwarded to you Dr. Tasia Catchings.  ?

## 2022-01-22 NOTE — Telephone Encounter (Signed)
Please advise.  ? ?Per last visit on 4/17: no follow up needed. He is schedule to see rheum on Monday 4/24.  ?

## 2022-01-25 ENCOUNTER — Ambulatory Visit: Payer: BC Managed Care – PPO

## 2022-01-25 ENCOUNTER — Ambulatory Visit: Payer: BC Managed Care – PPO | Admitting: Dermatology

## 2022-01-26 ENCOUNTER — Other Ambulatory Visit (HOSPITAL_COMMUNITY): Payer: BC Managed Care – PPO

## 2022-01-27 ENCOUNTER — Ambulatory Visit
Admission: RE | Admit: 2022-01-27 | Discharge: 2022-01-27 | Disposition: A | Discharge status: Home / Self Care | Payer: BC Managed Care – PPO | Source: Ambulatory Visit | Attending: Urology | Admitting: Urology

## 2022-01-27 DIAGNOSIS — R109 Unspecified abdominal pain: Secondary | ICD-10-CM | POA: Insufficient documentation

## 2022-01-27 LAB — LYME DISEASE SEROLOGY W/REFLEX: Lyme Total Antibody EIA: NEGATIVE

## 2022-01-27 IMAGING — US US RENAL
1 series · 14 of 25 positions shown · non-contrast
Comparison: None.

CLINICAL DATA: Flank pain

EXAM:
RENAL / URINARY TRACT ULTRASOUND COMPLETE

[Series 1: us renal · 14 of 36 slices shown]
[im 1/36]
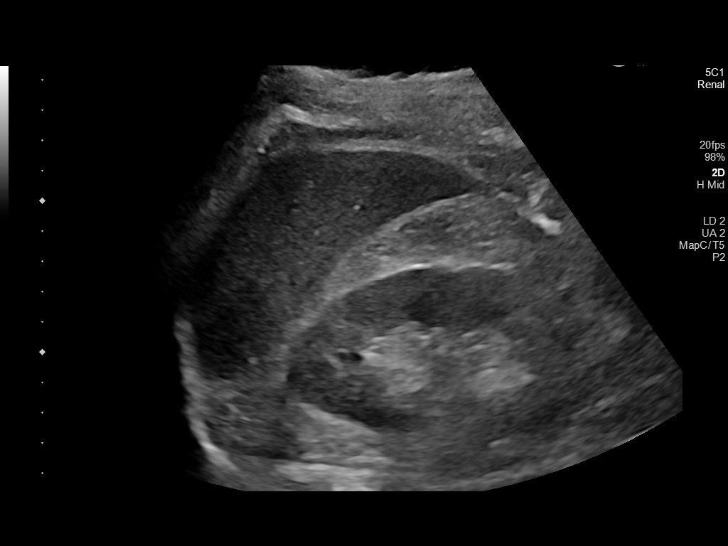
[im 3/36]
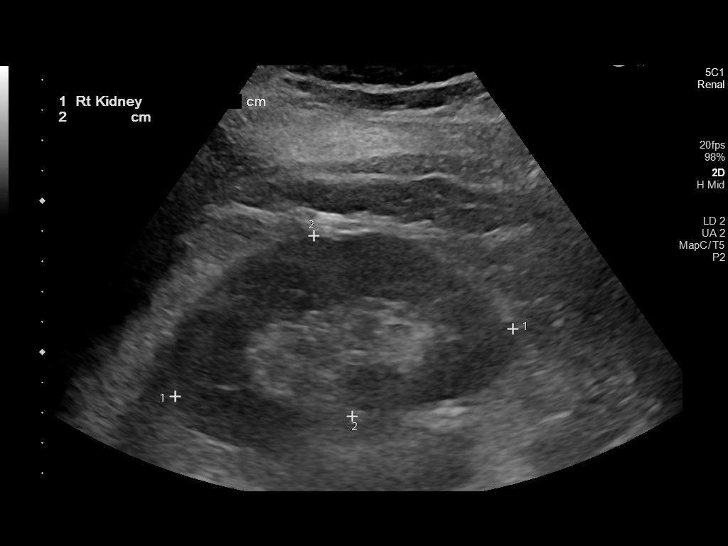
[im 6/36]
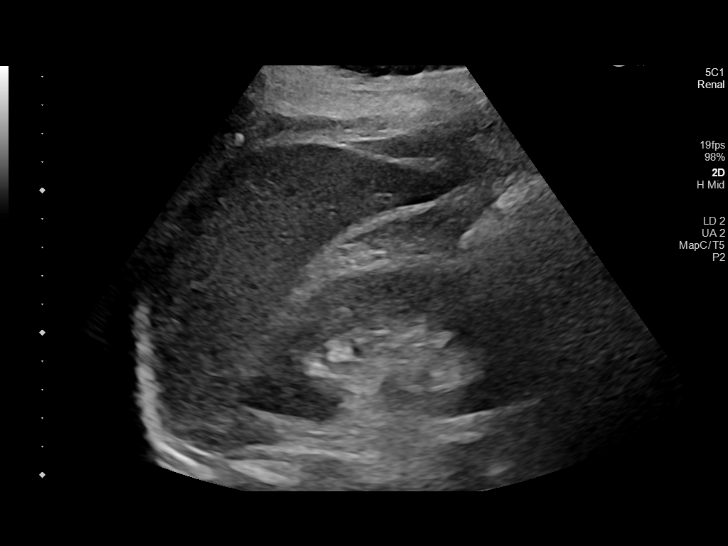
[im 9/36]
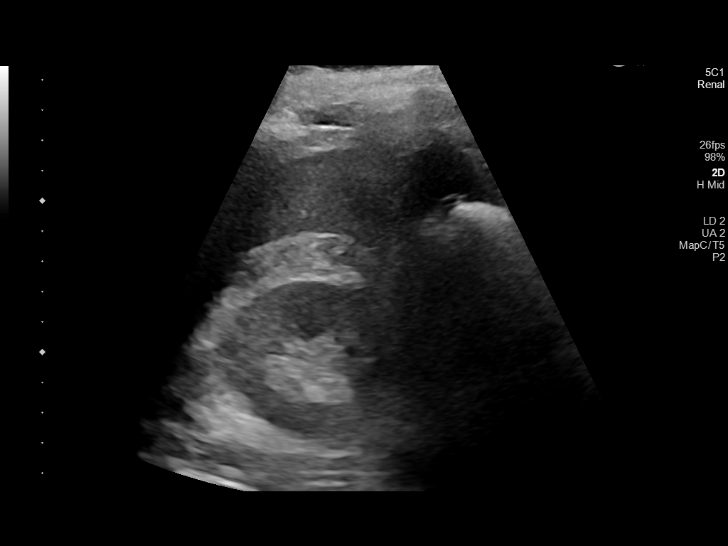
[im 12/36]
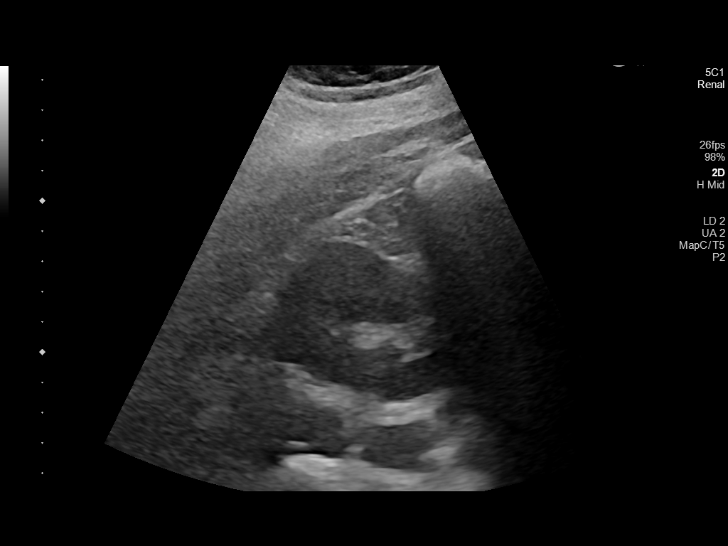
[im 14/36]
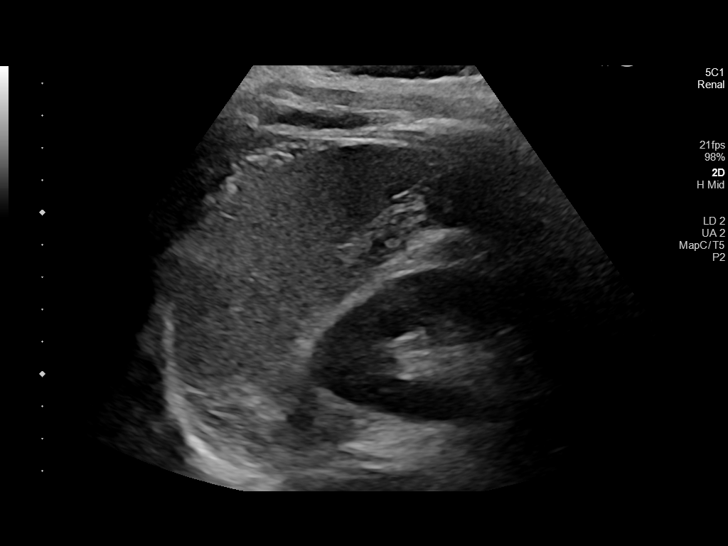
[im 17/36]
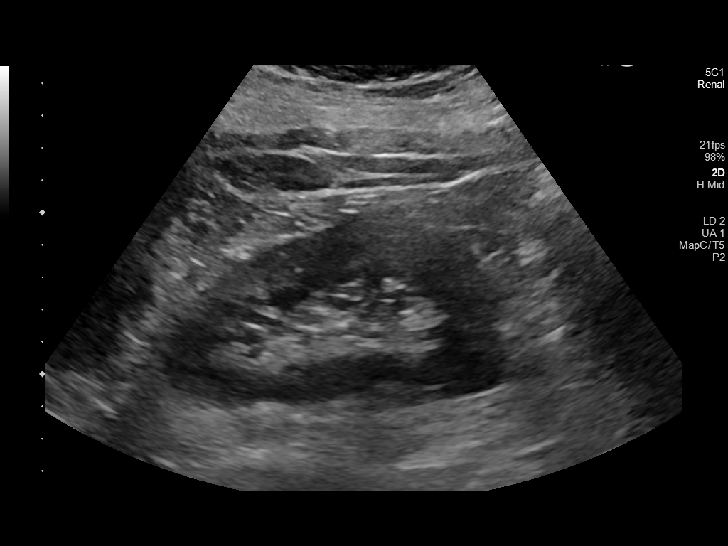
[im 19/36]
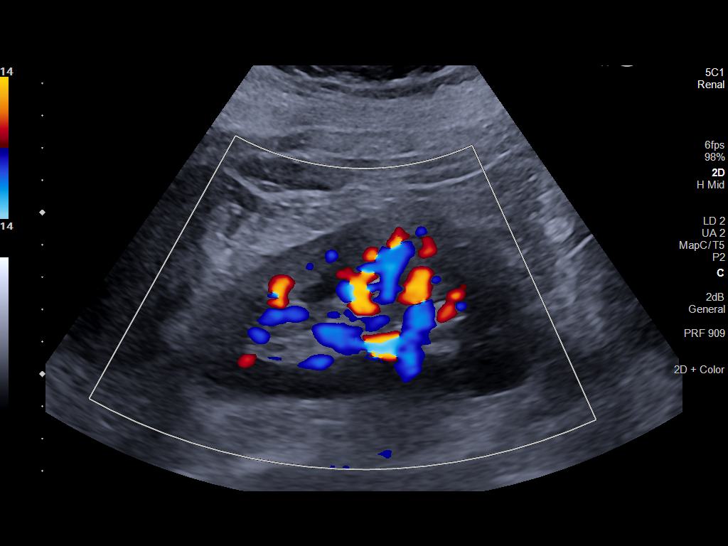
[im 22/36]
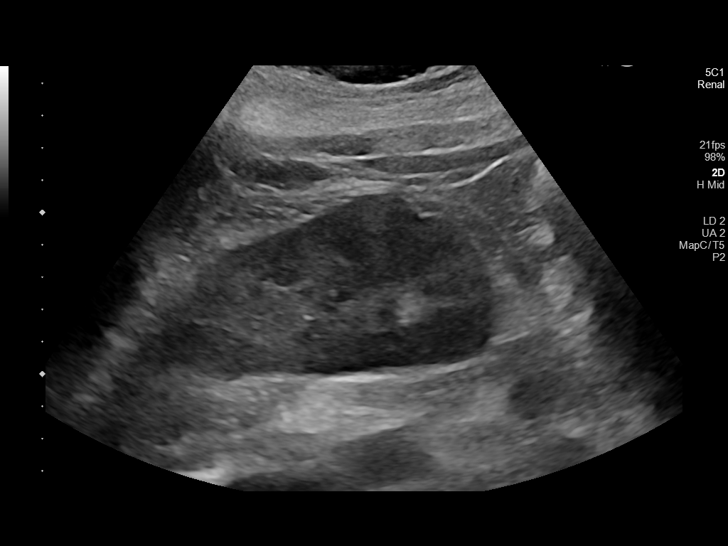
[im 24/36]
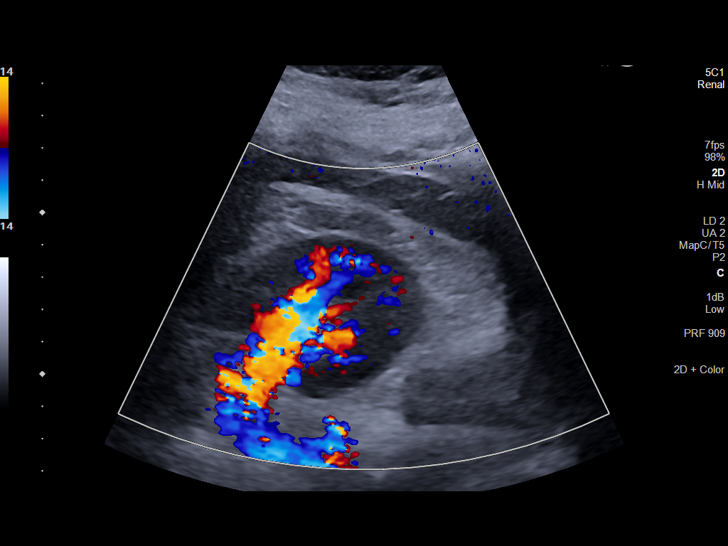
[im 27/36]
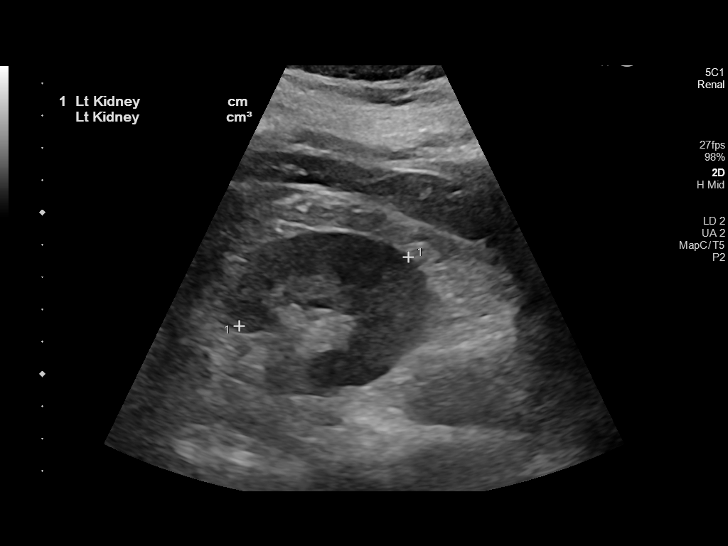
[im 30/36]
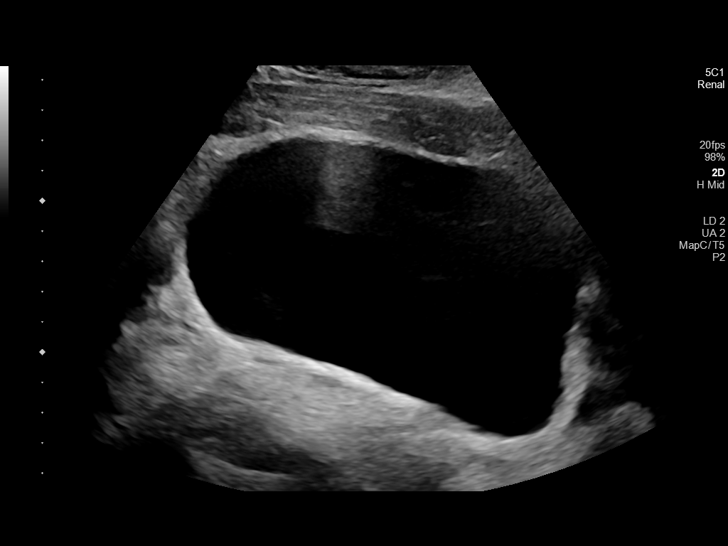
[im 33/36]
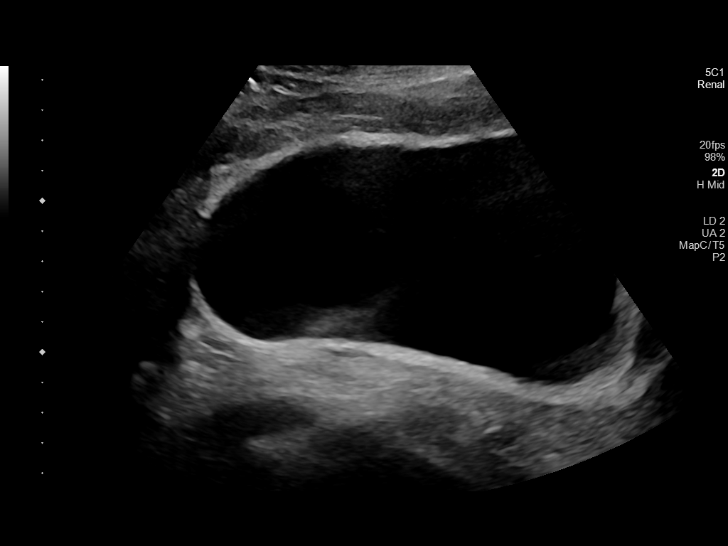
[im 36/36]
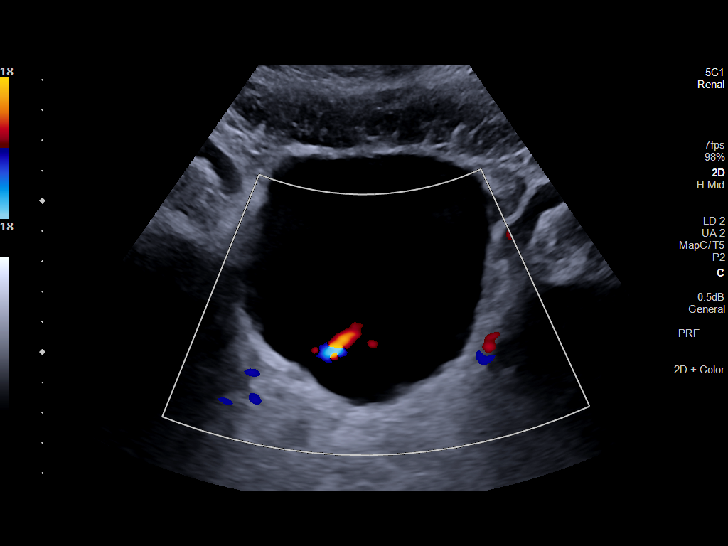

[14 of 25 positions shown; findings below may reference images not displayed]

FINDINGS: Right Kidney:

Renal measurements: 11.4 x 6.1 x 4.7 cm = volume: 171 mL.
Echogenicity within normal limits. No calculi, mass or
hydronephrosis visualized.

Left Kidney:

Renal measurements: 11.1 x 5.7 x 5.7 cm = volume: 187 mL.
Echogenicity within normal limits. No calculi, mass or
hydronephrosis visualized.

Bladder:

Appears normal for degree of bladder distention.

Other:

None.
IMPRESSION: Unremarkable exam.

## 2022-01-27 NOTE — Telephone Encounter (Signed)
Followed up on referral. Pt had appt with Dr. Posey Pronto on 01/26/22. ?

## 2022-01-28 ENCOUNTER — Telehealth: Payer: Self-pay

## 2022-01-28 NOTE — Telephone Encounter (Signed)
-----   Message from Brendolyn Patty, MD sent at 01/27/2022  3:13 PM EDT ----- ?Lyme antibody test is negative - please call patient ?

## 2022-01-28 NOTE — Telephone Encounter (Signed)
Lft pt msg to call for labs./sh ?

## 2022-01-29 ENCOUNTER — Encounter: Payer: BC Managed Care – PPO | Admitting: Physical Therapy

## 2022-02-01 ENCOUNTER — Ambulatory Visit: Payer: BC Managed Care – PPO | Admitting: Dermatology

## 2022-02-01 ENCOUNTER — Encounter: Payer: Self-pay | Admitting: *Deleted

## 2022-02-02 ENCOUNTER — Telehealth: Payer: Self-pay

## 2022-02-02 NOTE — Telephone Encounter (Signed)
Advised patient Lyme Antibody test was negative.  ?

## 2022-02-04 ENCOUNTER — Encounter: Payer: Self-pay | Admitting: Oncology

## 2022-02-09 ENCOUNTER — Other Ambulatory Visit: Payer: Self-pay | Admitting: Gastroenterology

## 2022-02-09 DIAGNOSIS — R1319 Other dysphagia: Secondary | ICD-10-CM

## 2022-02-12 ENCOUNTER — Encounter: Payer: BC Managed Care – PPO | Admitting: Physical Therapy

## 2022-02-12 ENCOUNTER — Ambulatory Visit
Admission: RE | Admit: 2022-02-12 | Discharge: 2022-02-12 | Disposition: A | Discharge status: Home / Self Care | Payer: BC Managed Care – PPO | Source: Ambulatory Visit | Attending: Gastroenterology | Admitting: Gastroenterology

## 2022-02-12 DIAGNOSIS — R1319 Other dysphagia: Secondary | ICD-10-CM | POA: Insufficient documentation

## 2022-02-12 IMAGING — RF DG ESOPHAGUS
9 of 10 series · 14 of 24 positions shown · non-contrast
Comparison: None Available.

CLINICAL DATA: Patient with a history of dysphagia. Patient
complains of pain and difficulty when swallowing certain foods

EXAM:
ESOPHAGUS/BARIUM SWALLOW/TABLET STUDY
TECHNIQUE: Combined double and single contrast examination was performed using
effervescent crystals, high-density barium, and thin liquid barium.
This exam was performed by SINGH NP, and was supervised
and interpreted by SINGH.
FLUOROSCOPY:
Radiation Exposure Index (as provided by the fluoroscopic device):
29.0 mGy Kerma

[Series 1: cp_standard · 0.25mm/px · 2 of 50 frames shown (1 of 7)]
[frame 8/50]
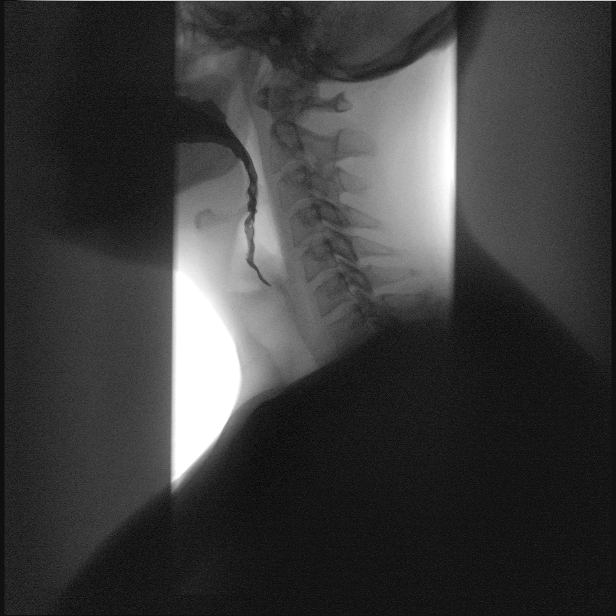
[frame 26/50]
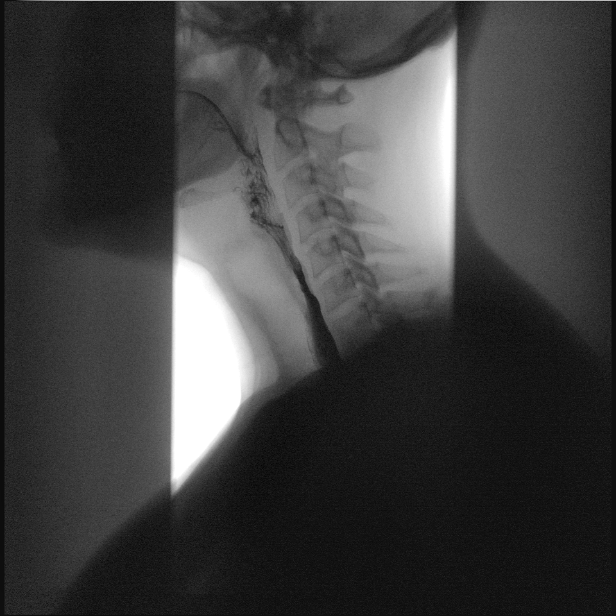

[Series 2: cp_standard · 0.25mm/px · 1 of 69 frames shown (2 of 7)]
[frame 11/69]
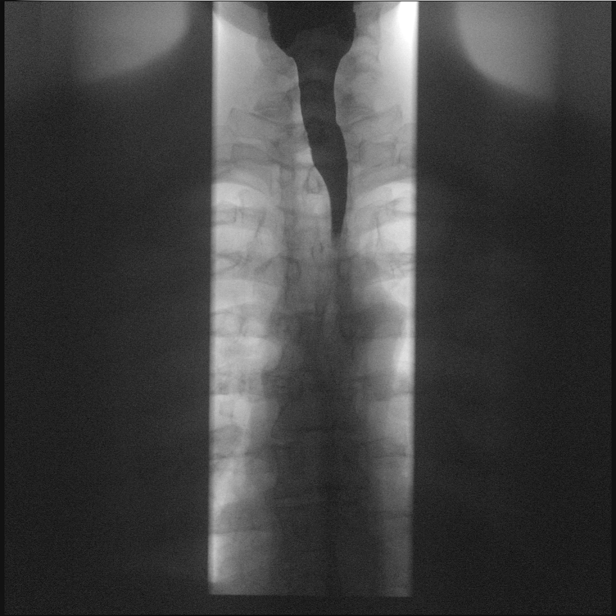

[Series 3: cp_standard · 0.25mm/px · 3 of 67 frames shown (3 of 7)]
[frame 11/67]
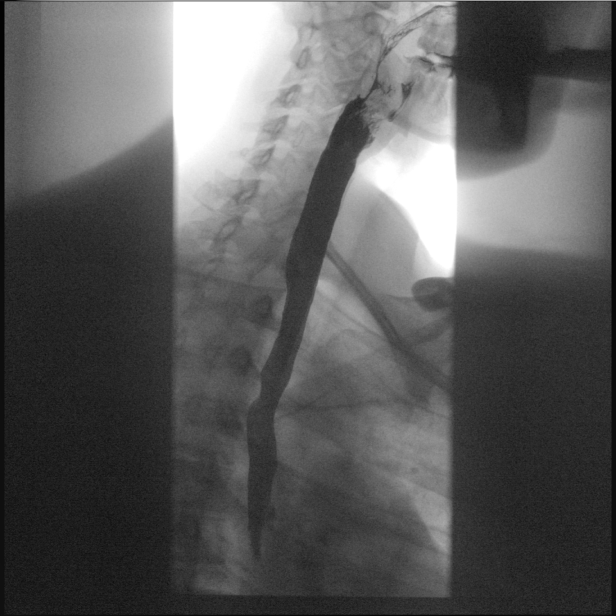
[frame 34/67]
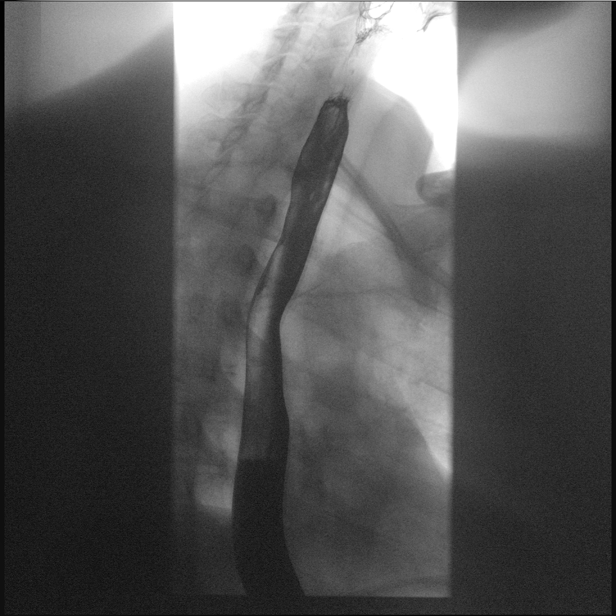
[frame 57/67]
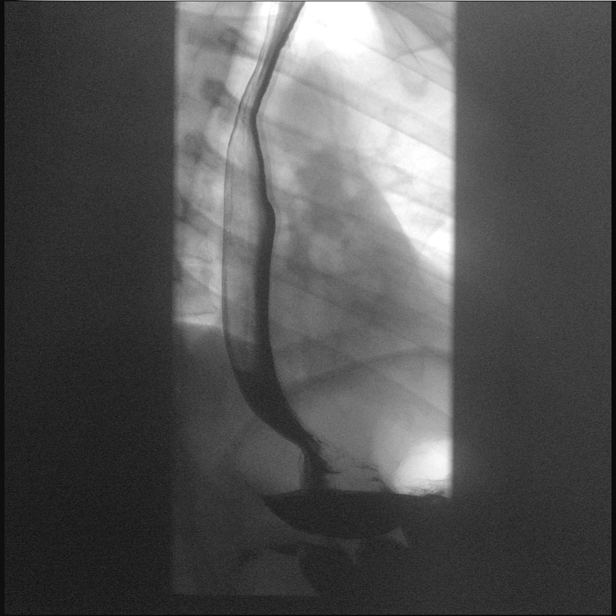

[Series 4: fluoro_barium 2fps_bw · 0.17mm/px · 2 of 5 frames shown (1 of 2)]
[frame 3/5]
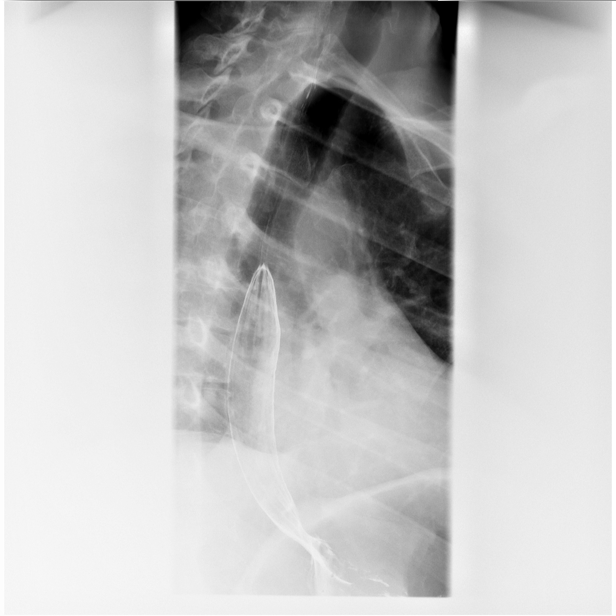
[frame 5/5]
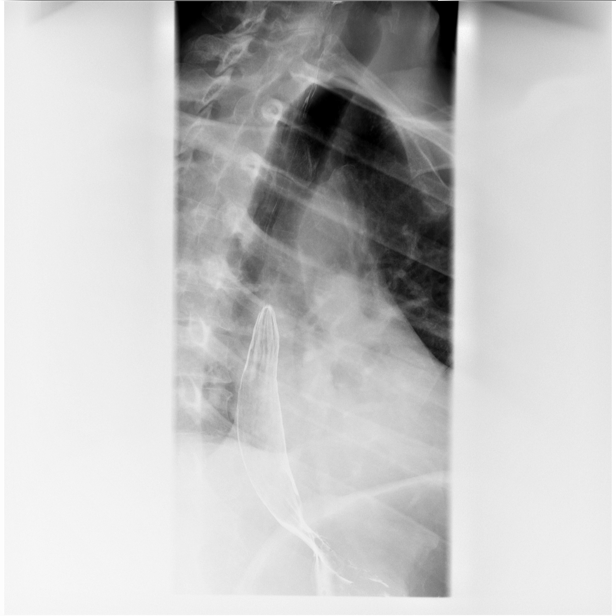

[Series 5: fluoro_barium 2fps_bw · 0.17mm/px · 1 of 2 frames shown (2 of 2)]
[frame 2/2]
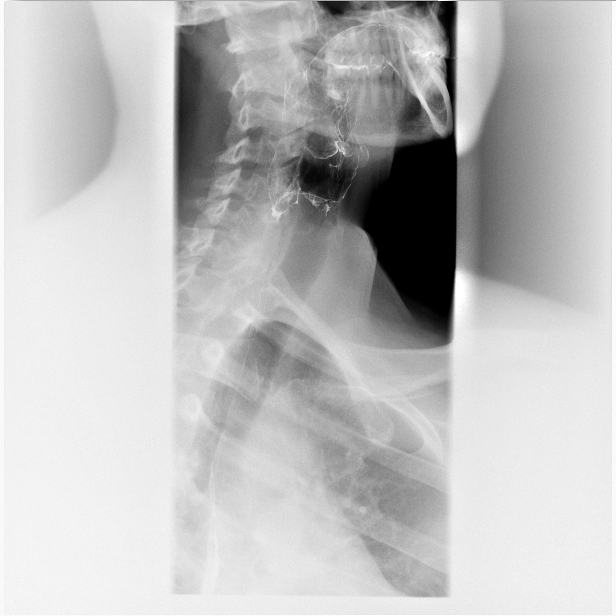

[Series 6: cp_standard · 0.26mm/px · 1 of 80 frames shown (4 of 7)]
[frame 41/80]
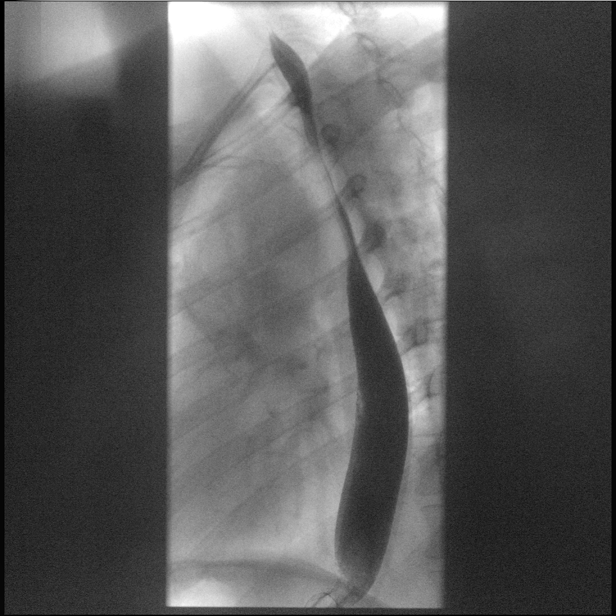

[Series 7: cp_standard · 0.26mm/px · 2 of 78 frames shown (5 of 7)]
[frame 40/78]
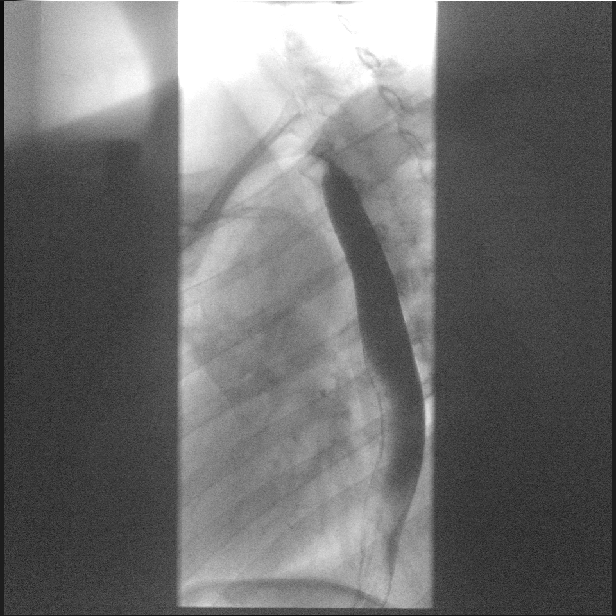
[frame 67/78]
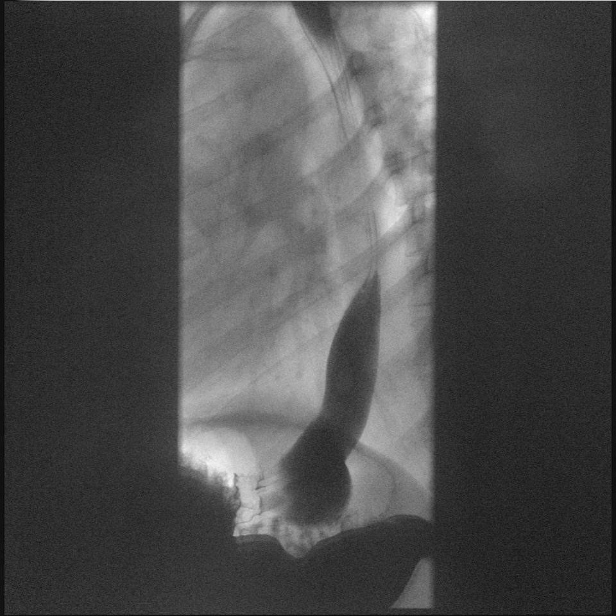

[Series 8: cp_standard · 0.17mm/px · 1 of 1 slices shown (6 of 7)]
[im 1/1]
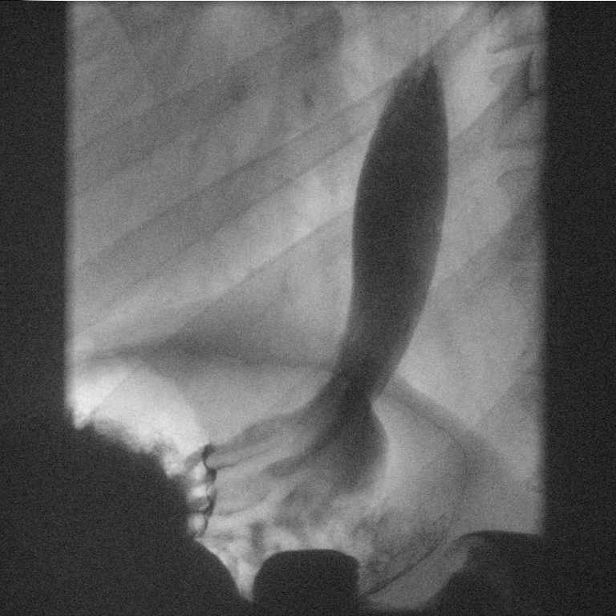

[Series 10: cp_standard · 0.25mm/px · 1 of 1 slices shown (7 of 7)]
[im 1/1]
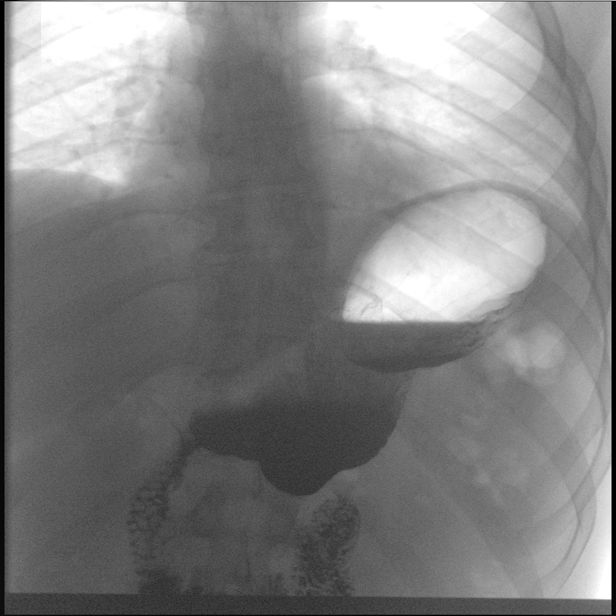

[14 of 24 positions shown; findings below may reference images not displayed]

FINDINGS: Swallowing: Appears normal. No vestibular penetration or aspiration
seen.

Pharynx: Unremarkable.

Esophagus: Normal appearance.

Esophageal motility: Within normal limits.

Hiatal Hernia: None.

Gastroesophageal reflux: None visualized.

Ingested 13mm barium tablet: Passed normally

Other: None.
IMPRESSION: Normal esophagram.

Read by: SINGH, NP

## 2022-02-22 ENCOUNTER — Other Ambulatory Visit: Payer: Self-pay

## 2022-02-22 ENCOUNTER — Telehealth: Payer: Self-pay

## 2022-02-22 ENCOUNTER — Encounter: Payer: Self-pay | Admitting: Oncology

## 2022-02-22 NOTE — Telephone Encounter (Signed)
Dr. Tasia Catchings referring patient to [facility] oncology/hematology. Have faxed last encounter, demographics and insurance information to [facility]. Also called patient's PCP and spoke with Tammy to inform Dr. Onnie Graham to have him call Dr. Tasia Catchings to further discuss patient. Tammy verbalized understanding.

## 2022-02-22 NOTE — Telephone Encounter (Signed)
Please advise 

## 2022-02-23 ENCOUNTER — Encounter: Payer: Self-pay | Admitting: Oncology

## 2022-02-23 ENCOUNTER — Encounter: Payer: BC Managed Care – PPO | Admitting: Physical Therapy

## 2022-02-25 ENCOUNTER — Encounter: Payer: Self-pay | Admitting: Gastroenterology

## 2022-02-25 NOTE — Telephone Encounter (Signed)
FYI

## 2022-02-26 ENCOUNTER — Encounter: Payer: Self-pay | Admitting: Gastroenterology

## 2022-02-26 ENCOUNTER — Ambulatory Visit
Admission: RE | Admit: 2022-02-26 | Discharge: 2022-02-26 | Disposition: A | Discharge status: Home / Self Care | Payer: BC Managed Care – PPO | Attending: Gastroenterology | Admitting: Gastroenterology

## 2022-02-26 ENCOUNTER — Ambulatory Visit: Payer: BC Managed Care – PPO | Admitting: Anesthesiology

## 2022-02-26 ENCOUNTER — Encounter
Admission: RE | Disposition: A | Discharge status: Home / Self Care | Payer: Self-pay | Source: Home / Self Care | Attending: Gastroenterology

## 2022-02-26 DIAGNOSIS — R001 Bradycardia, unspecified: Secondary | ICD-10-CM | POA: Diagnosis not present

## 2022-02-26 DIAGNOSIS — U099 Post covid-19 condition, unspecified: Secondary | ICD-10-CM | POA: Diagnosis not present

## 2022-02-26 DIAGNOSIS — F419 Anxiety disorder, unspecified: Secondary | ICD-10-CM | POA: Insufficient documentation

## 2022-02-26 DIAGNOSIS — G4730 Sleep apnea, unspecified: Secondary | ICD-10-CM | POA: Insufficient documentation

## 2022-02-26 DIAGNOSIS — G629 Polyneuropathy, unspecified: Secondary | ICD-10-CM | POA: Diagnosis not present

## 2022-02-26 DIAGNOSIS — K621 Rectal polyp: Secondary | ICD-10-CM | POA: Diagnosis not present

## 2022-02-26 DIAGNOSIS — K5901 Slow transit constipation: Secondary | ICD-10-CM | POA: Diagnosis present

## 2022-02-26 DIAGNOSIS — J45909 Unspecified asthma, uncomplicated: Secondary | ICD-10-CM | POA: Insufficient documentation

## 2022-02-26 DIAGNOSIS — F840 Autistic disorder: Secondary | ICD-10-CM | POA: Insufficient documentation

## 2022-02-26 DIAGNOSIS — K640 First degree hemorrhoids: Secondary | ICD-10-CM | POA: Diagnosis not present

## 2022-02-26 DIAGNOSIS — I10 Essential (primary) hypertension: Secondary | ICD-10-CM | POA: Insufficient documentation

## 2022-02-26 DIAGNOSIS — F319 Bipolar disorder, unspecified: Secondary | ICD-10-CM | POA: Insufficient documentation

## 2022-02-26 HISTORY — PX: COLONOSCOPY: SHX5424

## 2022-02-26 SURGERY — COLONOSCOPY
Anesthesia: General

## 2022-02-26 MED ORDER — MIDAZOLAM HCL 2 MG/2ML IJ SOLN
INTRAMUSCULAR | Status: DC | PRN
Start: 2022-02-26 — End: 2022-02-26
  Administered 2022-02-26: 1 mg via INTRAVENOUS

## 2022-02-26 MED ORDER — MIDAZOLAM HCL 2 MG/2ML IJ SOLN
INTRAMUSCULAR | Status: AC
  Filled 2022-02-26: qty 2

## 2022-02-26 MED ORDER — PROPOFOL 500 MG/50ML IV EMUL
INTRAVENOUS | Status: AC
  Filled 2022-02-26: qty 50

## 2022-02-26 MED ORDER — PROPOFOL 10 MG/ML IV BOLUS
INTRAVENOUS | Status: DC | PRN
Start: 2022-02-26 — End: 2022-02-26
  Administered 2022-02-26: 100 mg via INTRAVENOUS
  Administered 2022-02-26: 30 mg via INTRAVENOUS
  Administered 2022-02-26: 20 mg via INTRAVENOUS

## 2022-02-26 MED ORDER — SODIUM CHLORIDE 0.9 % IV SOLN: INTRAVENOUS | Status: DC

## 2022-02-26 MED ORDER — PROPOFOL 500 MG/50ML IV EMUL
INTRAVENOUS | Status: DC | PRN
  Administered 2022-02-26: 175 ug/kg/min via INTRAVENOUS

## 2022-02-26 MED ORDER — LIDOCAINE HCL (CARDIAC) PF 100 MG/5ML IV SOSY
PREFILLED_SYRINGE | INTRAVENOUS | Status: DC | PRN
  Administered 2022-02-26: 100 mg via INTRAVENOUS

## 2022-02-26 NOTE — Anesthesia Procedure Notes (Signed)
Anesthesia Procedure Note     

## 2022-02-26 NOTE — Anesthesia Preprocedure Evaluation (Addendum)
Anesthesia Evaluation  Patient identified by MRN, date of birth, ID band Patient awake    Reviewed: Allergy & Precautions, NPO status , Patient's Chart, lab work & pertinent test results  History of Anesthesia Complications Negative for: history of anesthetic complications  Airway Mallampati: I   Neck ROM: Full    Dental no notable dental hx.    Pulmonary asthma , sleep apnea , former smoker (quit 2013),  Long COVID   Pulmonary exam normal breath sounds clear to auscultation       Cardiovascular hypertension, Normal cardiovascular exam Rhythm:Regular Rate:Normal  ECG 12/25/21: NSR with sinus arrhythmia   Neuro/Psych PSYCHIATRIC DISORDERS (Autism spectrum disorder) Anxiety Depression Bipolar Disorder  Neuromuscular disease (neuropathy)    GI/Hepatic negative GI ROS,   Endo/Other  negative endocrine ROS  Renal/GU negative Renal ROS     Musculoskeletal   Abdominal   Peds  Hematology negative hematology ROS (+)   Anesthesia Other Findings   Reproductive/Obstetrics                            Anesthesia Physical Anesthesia Plan  ASA: 2  Anesthesia Plan: General   Post-op Pain Management:    Induction: Intravenous  PONV Risk Score and Plan: 2 and Propofol infusion, TIVA and Treatment may vary due to age or medical condition  Airway Management Planned: Natural Airway  Additional Equipment:   Intra-op Plan:   Post-operative Plan:   Informed Consent: I have reviewed the patients History and Physical, chart, labs and discussed the procedure including the risks, benefits and alternatives for the proposed anesthesia with the patient or authorized representative who has indicated his/her understanding and acceptance.       Plan Discussed with: CRNA  Anesthesia Plan Comments: (LMA/GETA backup discussed.  Patient consented for risks of anesthesia including but not limited to:  - adverse  reactions to medications - damage to eyes, teeth, lips or other oral mucosa - nerve damage due to positioning  - sore throat or hoarseness - damage to heart, brain, nerves, lungs, other parts of body or loss of life  Informed patient about role of CRNA in peri- and intra-operative care.  Patient voiced understanding.)        Anesthesia Quick Evaluation

## 2022-02-26 NOTE — Interval H&P Note (Signed)
History and Physical Interval Note: Preprocedure H&P from 02/26/22  was reviewed and there was no interval change after seeing and examining the patient.  Written consent was obtained from the patient after discussion of risks, benefits, and alternatives. Patient has consented to proceed with Colonoscopy with possible intervention    02/26/2022 10:46 AM  Seth Calderon  has presented today for surgery, with the diagnosis of Chronic constipation (K59.09) Inadequate defecatory propulsion (R19.8) Elevated fecal calprotectin (R19.5).  The various methods of treatment have been discussed with the patient and family. After consideration of risks, benefits and other options for treatment, the patient has consented to  Procedure(s): COLONOSCOPY (N/A) as a surgical intervention.  The patient's history has been reviewed, patient examined, no change in status, stable for surgery.  I have reviewed the patient's chart and labs.  Questions were answered to the patient's satisfaction.     Annamaria Helling

## 2022-02-26 NOTE — H&P (Addendum)
Pre-Procedure H&P   Patient ID: Seth Calderon is a 31 y.o. male.  Gastroenterology Provider: Annamaria Helling, DO  PCP: Orpah Greek, DO  Date: 02/26/2022  HPI Seth Calderon is a 31 y.o. male who presents today for Colonoscopy for increased fecal cal; abnormal colonic motility.  Still with slowed colonic transit. He did complete the bowel cleanse earlier this week to assist with colonoscopy cleanout. Still feels bowels are moving slowly. Has noted bradycardia at points in the 40s. No abdominal pain/n/v. Minimal blood with wiping. Sees a fair amount of mucus with stool.  Weight has stabilized after loss, but appetite diminished. Not currentlyw earing O2.  No other acute gi complaints.  Hgb 14.4, mcv 94, plt 354K  Csy- 2021- random bx negative for microscopic colitis   Past Medical History:  Diagnosis Date   Anxiety    Asthma    Autism spectrum disorder    Bipolar 1 disorder ([place])    COVID-19 virus infection    History of depression    History of exposure to toxins via inhalation    History of pneumonia    Hypertension    Neuropathy    Sleep apnea     Past Surgical History:  Procedure Laterality Date   APPENDECTOMY     fever of unknow origin     open redution radius fracture     TONSILLECTOMY      Family History Mother- colon polyps No h/o GI disease or malignancy  Review of Systems  Constitutional:  Positive for appetite change and unexpected weight change. Negative for activity change, chills, diaphoresis, fatigue and fever.  HENT:  Negative for trouble swallowing and voice change.   Respiratory:  Negative for shortness of breath and wheezing.   Cardiovascular:  Negative for chest pain, palpitations and leg swelling.  Gastrointestinal:  Positive for abdominal pain, blood in stool and constipation. Negative for abdominal distention, anal bleeding, diarrhea, nausea and vomiting.  Musculoskeletal:  Negative for arthralgias  and myalgias.  Skin:  Negative for color change and pallor.  Neurological:  Positive for dizziness, weakness and headaches. Negative for syncope.  Psychiatric/Behavioral:  Negative for confusion. The patient is not nervous/anxious.   All other systems reviewed and are negative.   Medications No current facility-administered medications on file prior to encounter.   Current Outpatient Medications on File Prior to Encounter  Medication Sig Dispense Refill   Acetylcysteine 600 MG CAPS Take by mouth.     buPROPion (WELLBUTRIN XL) 300 MG 24 hr tablet      chlorthalidone (HYGROTON) 25 MG tablet Take 25 mg by mouth daily.     DULoxetine (CYMBALTA) 20 MG capsule Take 20 mg by mouth daily.     Esomeprazole Magnesium (NEXIUM PO) Take by mouth.     gabapentin (NEURONTIN) 300 MG capsule Take 300 mg by mouth 3 (three) times daily.     guanFACINE (INTUNIV) 4 MG TB24 ER tablet Take 4 mg by mouth daily.     losartan (COZAAR) 25 MG tablet Take 25 mg by mouth daily.     promethazine (PHENERGAN) 25 MG tablet Take 25 mg by mouth every 6 (six) hours as needed for nausea or vomiting.     tamsulosin (FLOMAX) 0.4 MG CAPS capsule Take 0.4 mg by mouth.     acetaminophen (TYLENOL) 500 MG tablet Take by mouth.     albuterol (VENTOLIN HFA) 108 (90 Base) MCG/ACT inhaler Inhale into the lungs.     amphetamine-dextroamphetamine (ADDERALL) 10 MG  tablet Take 10 mg by mouth. With lunch (Patient not taking: Reported on 01/18/2022)     amphetamine-dextroamphetamine (ADDERALL) 20 MG tablet Take 20 mg by mouth daily. (Patient not taking: Reported on 01/18/2022)     Cholecalciferol (VITAMIN D3) 250 MCG (10000 UT) TABS      clonazePAM (KLONOPIN) 1 MG tablet Take 1 mg by mouth daily as needed. (Patient not taking: Reported on 01/18/2022)     clonazePAM (KLONOPIN) 1 MG tablet Take by mouth.     fluocinonide (LIDEX) 0.05 % external solution Spot treat affected areas scalp 1-2 times a day as needed for flares. (Patient not taking:  Reported on 01/18/2022) 60 mL 2   Glycopyrronium Tosylate (QBREXZA) 2.4 % PADS Wipe one pad to each underarm every evening after showering. (Patient not taking: Reported on 01/18/2022) 30 each 5   hydrocortisone (ANUSOL-HC) 25 MG suppository Place rectally.     Hyoscyamine Sulfate SL 0.125 MG SUBL Place under the tongue.     ibuprofen (ADVIL) 800 MG tablet Take 800 mg by mouth every 8 (eight) hours as needed.     ketoconazole (NIZORAL) 2 % cream Apply to affected areas body 1-2 times a day prn flares of tinea versicolor. (Patient not taking: Reported on 01/18/2022) 60 g 3   ketoconazole (NIZORAL) 2 % shampoo Massage into scalp, let sit several minutes before rinsing. May also use as a body was for tinea versicolor. (Patient not taking: Reported on 01/18/2022) 120 mL 3   Melatonin 10 MG TABS Take 10 mg by mouth as needed. (Patient not taking: Reported on 01/18/2022)     methocarbamol (ROBAXIN) 500 MG tablet Take 500 mg by mouth 4 (four) times daily. (Patient not taking: Reported on 01/18/2022)     methocarbamol (ROBAXIN) 500 MG tablet Take by mouth.     mometasone-formoterol (DULERA) 200-5 MCG/ACT AERO Inhale 2 puffs into the lungs 2 (two) times daily. (Patient not taking: Reported on 01/18/2022)     mupirocin ointment (BACTROBAN) 2 % Apply to open areas on body 1-2 times a day until healed. Cover with bandage and avoid picking. (Patient not taking: Reported on 01/18/2022) 22 g 1   ondansetron (ZOFRAN-ODT) 8 MG disintegrating tablet Take 8 mg by mouth every 8 (eight) hours as needed for nausea or vomiting.     promethazine (PHENERGAN) 25 MG tablet Take by mouth.      Pertinent medications related to GI and procedure were reviewed by me with the patient prior to the procedure   Current Facility-Administered Medications:    0.9 %  sodium chloride infusion, , Intravenous, Continuous, Annamaria Helling, DO, Last Rate: 20 mL/hr at 02/26/22 1009, New Bag at 02/26/22 1009      Allergies  Allergen  Reactions   Latex Swelling   Cefaclor Other (See Comments)    Mom told him this when he was under 24yo.   Allergies were reviewed by me prior to the procedure  Objective   Body mass index is 29.55 kg/m. Vitals:   02/26/22 0950  BP: (!) 148/103  Pulse: 98  Resp: 18  Temp: (!) 97 F (36.1 C)  TempSrc: Temporal  SpO2: 100%  Weight: 101.6 kg  Height: '6\' 1"'$  (1.854 m)     Physical Exam Vitals and nursing note reviewed.  Constitutional:      General: He is not in acute distress.    Appearance: Normal appearance. He is not ill-appearing, toxic-appearing or diaphoretic.  HENT:     Head: Normocephalic and atraumatic.  Nose: Nose normal.     Mouth/Throat:     Mouth: Mucous membranes are moist.     Pharynx: Oropharynx is clear.  Eyes:     General: No scleral icterus.    Extraocular Movements: Extraocular movements intact.  Cardiovascular:     Rate and Rhythm: Normal rate and regular rhythm.     Heart sounds: Normal heart sounds. No murmur heard.   No friction rub. No gallop.  Pulmonary:     Effort: Pulmonary effort is normal. No respiratory distress.     Breath sounds: Normal breath sounds. No wheezing, rhonchi or rales.  Abdominal:     General: Bowel sounds are normal. There is no distension.     Palpations: Abdomen is soft.     Tenderness: There is no abdominal tenderness. There is no guarding or rebound.  Musculoskeletal:     Cervical back: Neck supple.     Right lower leg: No edema.     Left lower leg: No edema.  Skin:    General: Skin is warm and dry.     Coloration: Skin is not jaundiced or pale.  Neurological:     Mental Status: He is alert and oriented to person, place, and time. Mental status is at baseline.     Comments: Some abnormal tic movements of head and extremities  Psychiatric:        Mood and Affect: Mood normal.        Behavior: Behavior normal.        Thought Content: Thought content normal.        Judgment: Judgment normal.      Assessment:  Seth Calderon is a 31 y.o. male  who presents today for Colonoscopy for increased fecal cal; abnormal colonic motility.  Plan:  Colonoscopy with possible intervention today  Colonoscopy with possible biopsy, control of bleeding, polypectomy, and interventions as necessary has been discussed with the patient/patient representative. Informed consent was obtained from the patient/patient representative after explaining the indication, nature, and risks of the procedure including but not limited to death, bleeding, perforation, missed neoplasm/lesions, cardiorespiratory compromise, and reaction to medications. Opportunity for questions was given and appropriate answers were provided. Patient/patient representative has verbalized understanding is amenable to undergoing the procedure.   Annamaria Helling, DO  [facility]  Portions of the record may have been created with voice recognition software. Occasional wrong-word or 'sound-a-like' substitutions may have occurred due to the inherent limitations of voice recognition software.  Read the chart carefully and recognize, using context, where substitutions may have occurred.

## 2022-02-26 NOTE — Op Note (Signed)
[facility] Gastroenterology Patient Name: Seth Calderon Procedure Date: 02/26/2022 10:51 AM MRN: [id number] Account #: [id number] Date of Birth: 17-Sep-1991 Admit Type: Outpatient Age: 31 Room: [facility] ENDO ROOM 1 Gender: Male Note Status: Finalized Instrument Name: Colonoscope [id number] Procedure:             Colonoscopy Indications:           Slow transit constipation Providers:             Rueben Bash, DO Referring MD:          [facility] Medicines:             Monitored Anesthesia Care Complications:         No immediate complications. Estimated blood loss:                         Minimal. Procedure:             Pre-Anesthesia Assessment:                        - Prior to the procedure, a History and Physical was                         performed, and patient medications and allergies were                         reviewed. The patient is competent. The risks and                         benefits of the procedure and the sedation options and                         risks were discussed with the patient. All questions                         were answered and informed consent was obtained.                         Patient identification and proposed procedure were                         verified by the physician, the nurse, the anesthetist                         and the technician in the endoscopy suite. Mental                         Status Examination: alert and oriented. Airway                         Examination: normal oropharyngeal airway and neck                         mobility. Respiratory Examination: clear to                         auscultation. CV Examination: RRR, no murmurs, no S3  or S4. Prophylactic Antibiotics: The patient does not                         require prophylactic antibiotics. Prior                         Anticoagulants: The patient has taken no previous                          anticoagulant or antiplatelet agents. ASA Grade                         Assessment: II - A patient with mild systemic disease.                         After reviewing the risks and benefits, the patient                         was deemed in satisfactory condition to undergo the                         procedure. The anesthesia plan was to use monitored                         anesthesia care (MAC). Immediately prior to                         administration of medications, the patient was                         re-assessed for adequacy to receive sedatives. The                         heart rate, respiratory rate, oxygen saturations,                         blood pressure, adequacy of pulmonary ventilation, and                         response to care were monitored throughout the                         procedure. The physical status of the patient was                         re-assessed after the procedure.                        After obtaining informed consent, the colonoscope was                         passed under direct vision. Throughout the procedure,                         the patient's blood pressure, pulse, and oxygen                         saturations were monitored continuously. The  Colonoscope was introduced through the anus and                         advanced to the the terminal ileum, with                         identification of the appendiceal orifice and IC                         valve. The colonoscopy was performed without                         difficulty. The patient tolerated the procedure well.                         The quality of the bowel preparation was evaluated                         using the BBPS [facility] Bowel Preparation Scale) with                         scores of: Right Colon = 3, Transverse Colon = 3 and                         Left Colon = 3 (entire mucosa seen well with no                         residual staining,  small fragments of stool or opaque                         liquid). The total BBPS score equals 9. The terminal                         ileum, ileocecal valve, appendiceal orifice, and                         rectum were photographed. Findings:      The perianal and digital rectal examinations were normal. Pertinent       negatives include improved sphincter tone.      The terminal ileum appeared normal. Biopsies were taken with a cold       forceps for histology. Estimated blood loss was minimal.      A 1 mm polyp was found in the rectum. The polyp was sessile. The polyp       was removed with a jumbo cold forceps. Resection and retrieval were       complete. Estimated blood loss was minimal.      Normal mucosa was found in the entire colon. Biopsies for histology were       taken with a cold forceps from the right colon, left colon and       transverse colon for evaluation of microscopic colitis. Estimated blood       loss was minimal.      Non-bleeding internal hemorrhoids were found during retroflexion. The       hemorrhoids were Grade I (internal hemorrhoids that do not prolapse).       Estimated blood loss: none.      The exam was otherwise without abnormality  on direct and retroflexion       views. Impression:            - The examined portion of the ileum was normal.                         Biopsied.                        - One 1 mm polyp in the rectum, removed with a jumbo                         cold forceps. Resected and retrieved.                        - Normal mucosa in the entire examined colon. Biopsied.                        - Non-bleeding internal hemorrhoids.                        - The examination was otherwise normal on direct and                         retroflexion views. Recommendation:        - Discharge patient to home.                        - Resume previous diet.                        - Continue present medications.                        - No aspirin,  ibuprofen, naproxen, or other                         non-steroidal anti-inflammatory drugs for 5 days after                         polyp removal.                        - Await pathology results.                        - Repeat colonoscopy for surveillance based on                         pathology results.                        - Return to GI office as previously scheduled.                        - The findings and recommendations were discussed with                         the patient. Procedure Code(s):     --- Professional ---                        [phone], Colonoscopy, flexible; with  biopsy, single or                         multiple Diagnosis Code(s):     --- Professional ---                        K64.0, First degree hemorrhoids                        K62.1, Rectal polyp                        K59.01, Slow transit constipation CPT copyright 2019 American Medical Association. All rights reserved. The codes documented in this report are preliminary and upon coder review may  be revised to meet current compliance requirements. Attending Participation:      I personally performed the entire procedure. Volney American, DO Annamaria Helling DO, DO 02/26/2022 11:26:23 AM This report has been signed electronically. Number of Addenda: 0 Note Initiated On: 02/26/2022 10:51 AM Scope Withdrawal Time: 0 hours 11 minutes 13 seconds  Total Procedure Duration: 0 hours 15 minutes 35 seconds  Estimated Blood Loss:  Estimated blood loss was minimal.      [facility]

## 2022-02-26 NOTE — Anesthesia Postprocedure Evaluation (Signed)
Anesthesia Post Note  Patient: Kannen Moxey  Procedure(s) Performed: COLONOSCOPY  Patient location during evaluation: PACU Anesthesia Type: General Level of consciousness: awake and alert, oriented and patient cooperative Pain management: pain level controlled Vital Signs Assessment: post-procedure vital signs reviewed and stable Respiratory status: spontaneous breathing, nonlabored ventilation and respiratory function stable Cardiovascular status: blood pressure returned to baseline and stable Postop Assessment: adequate PO intake Anesthetic complications: no   No notable events documented.   Last Vitals:  Vitals:   02/26/22 1134 02/26/22 1139  BP: 132/85 (!) 125/93  Pulse: 76 67  Resp: 20 16  Temp: (!) 36.1 C   SpO2:      Last Pain:  Vitals:   02/26/22 1134  TempSrc: Temporal  PainSc: 0-No pain                 Darrin Nipper

## 2022-02-26 NOTE — Transfer of Care (Signed)
Immediate Anesthesia Transfer of Care Note  Patient: Seth Calderon  Procedure(s) Performed: COLONOSCOPY  Patient Location: Endoscopy Unit  Anesthesia Type:General  Level of Consciousness: awake  Airway & Oxygen Therapy: Patient Spontanous Breathing  Post-op Assessment: Report given to RN and Post -op Vital signs reviewed and stable  Post vital signs: Reviewed and stable  Last Vitals:  Vitals Value Taken Time  BP 129/87 02/26/22 1120  Temp    Pulse 95 02/26/22 1120  Resp 18 02/26/22 1120  SpO2 100 % 02/26/22 1120  Vitals shown include unvalidated device data.  Last Pain:  Vitals:   02/26/22 0950  TempSrc: Temporal  PainSc: 7          Complications: No notable events documented.

## 2022-03-02 ENCOUNTER — Encounter: Payer: Self-pay | Admitting: Gastroenterology

## 2022-03-02 ENCOUNTER — Other Ambulatory Visit: Payer: Self-pay | Admitting: Internal Medicine

## 2022-03-02 DIAGNOSIS — M6281 Muscle weakness (generalized): Secondary | ICD-10-CM

## 2022-03-02 DIAGNOSIS — R292 Abnormal reflex: Secondary | ICD-10-CM

## 2022-03-02 DIAGNOSIS — R9089 Other abnormal findings on diagnostic imaging of central nervous system: Secondary | ICD-10-CM

## 2022-03-02 LAB — SURGICAL PATHOLOGY

## 2022-03-09 ENCOUNTER — Telehealth: Payer: Self-pay | Admitting: *Deleted

## 2022-03-09 NOTE — Telephone Encounter (Signed)
Patient called and left message late yesterday afternoon requesting that Dr Tasia Catchings please call Dr Hart Robinsons at [facility] to do a peer to peer for urgency of his condition and to expedite his referral ASAP

## 2022-03-12 ENCOUNTER — Ambulatory Visit
Admission: RE | Admit: 2022-03-12 | Discharge: 2022-03-12 | Disposition: A | Discharge status: Home / Self Care | Payer: 59 | Source: Ambulatory Visit | Attending: Internal Medicine | Admitting: Internal Medicine

## 2022-03-12 DIAGNOSIS — R9089 Other abnormal findings on diagnostic imaging of central nervous system: Secondary | ICD-10-CM | POA: Insufficient documentation

## 2022-03-12 DIAGNOSIS — R292 Abnormal reflex: Secondary | ICD-10-CM | POA: Diagnosis present

## 2022-03-12 DIAGNOSIS — M6281 Muscle weakness (generalized): Secondary | ICD-10-CM | POA: Diagnosis present

## 2022-03-12 IMAGING — MR MR CERVICAL SPINE WO/W CM
5 of 8 series · 28 of 48 positions shown · IV contrast (gadavist)
Comparison: Same-day brain MRI [DATE].

CLINICAL DATA: Provided history: Muscle weakness, hyperreflexia.
Additional history provided by scanning technologist: Patient
reports tremor in right hand and arm, neuropathy, falls, headaches,
cognitive changes, unexplained fevers and bruising, possible toxic
exposure.

EXAM:
MRI CERVICAL SPINE WITHOUT AND WITH CONTRAST
TECHNIQUE: Multiplanar and multiecho pulse sequences of the cervical spine, to
include the craniocervical junction and cervicothoracic junction,
were obtained without and with intravenous contrast.
CONTRAST:  10 mL GADAVIST GADOBUTROL 1 MMOL/ML IV SOLN

[Series 5: T2 · sagittal · 3.0mm · 0.62mm/px · 4 of 15 slices shown (1 of 2)]
[im 1/15]
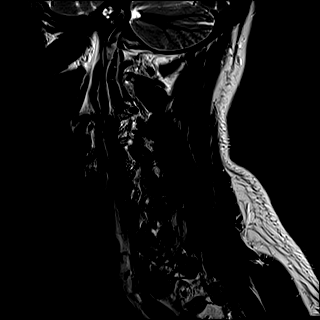
[im 5/15]
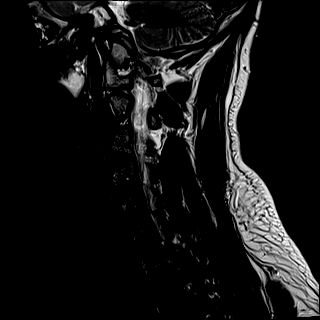
[im 10/15]
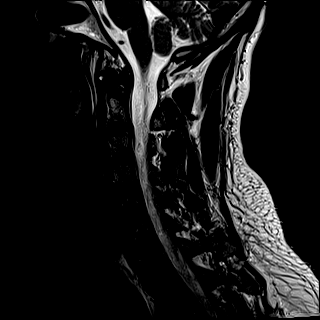
[im 15/15]
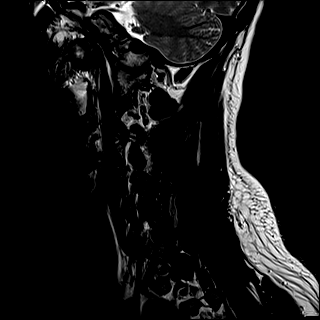

[Series 7: STIR · sagittal · 3.0mm · 0.62mm/px · 4 of 15 slices shown]
[im 1/15]
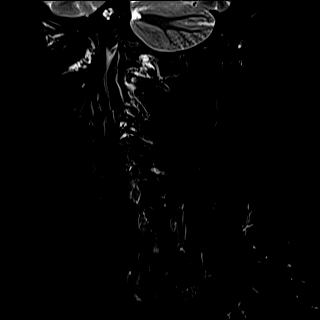
[im 5/15]
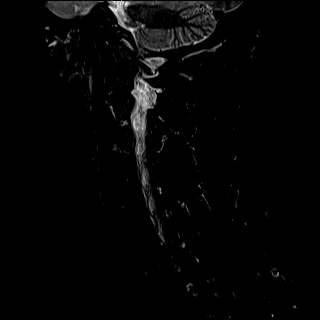
[im 10/15]
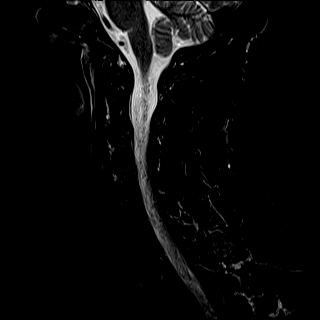
[im 15/15]
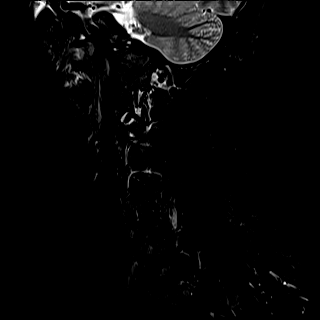

[Series 8: T2 · axial · 3.0mm · 0.70mm/px · z∈[-205,-112]mm · 8 of 29 slices shown (2 of 2)]
[im 1/29]
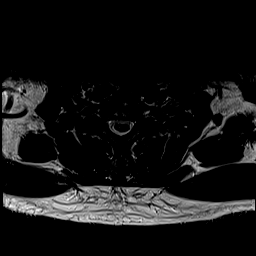
[im 5/29]
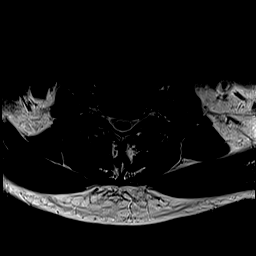
[im 9/29]
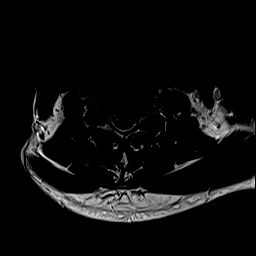
[im 13/29]
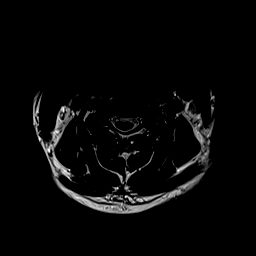
[im 17/29]
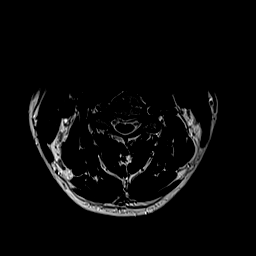
[im 21/29]
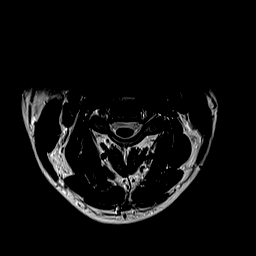
[im 25/29]
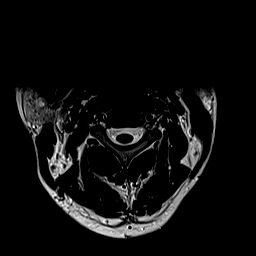
[im 29/29]
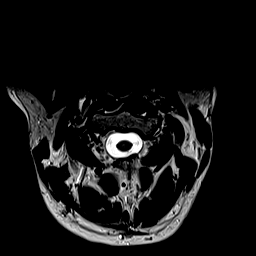

[Series 10: T1 · axial · non-contrast · 3.0mm · 0.35mm/px · z∈[-205,-112]mm · 8 of 29 slices shown]
[im 1/29]
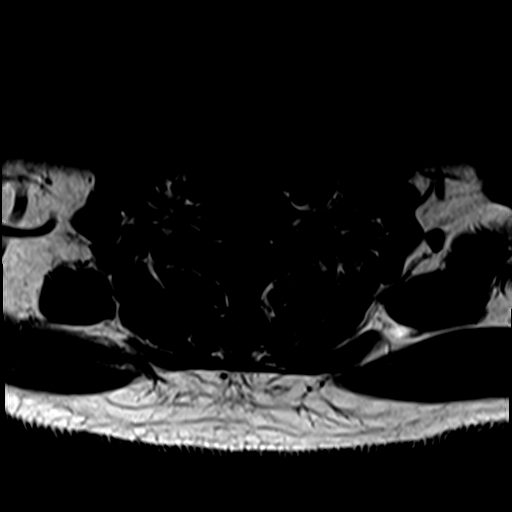
[im 5/29]
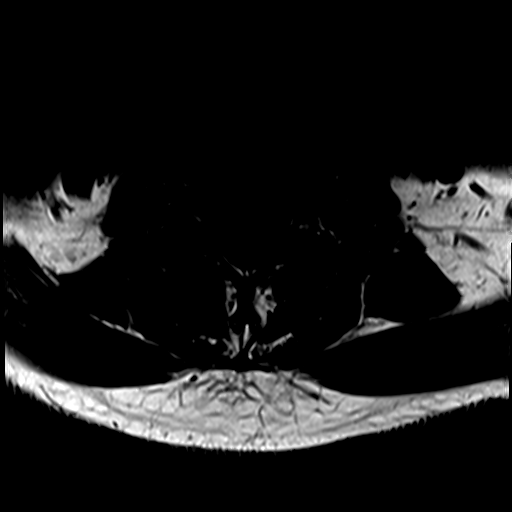
[im 9/29]
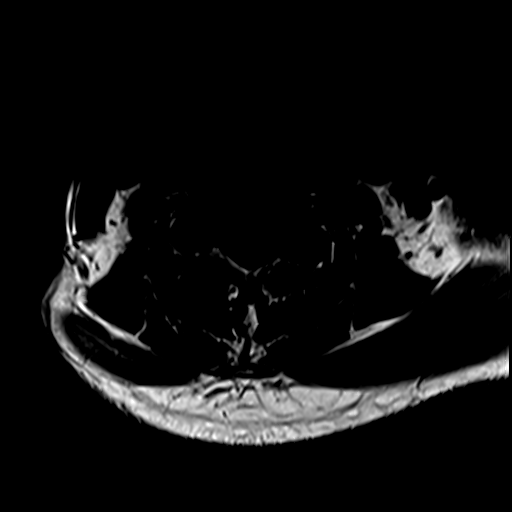
[im 13/29]
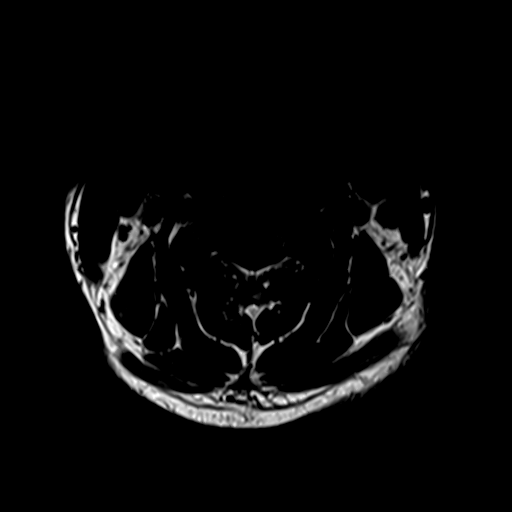
[im 17/29]
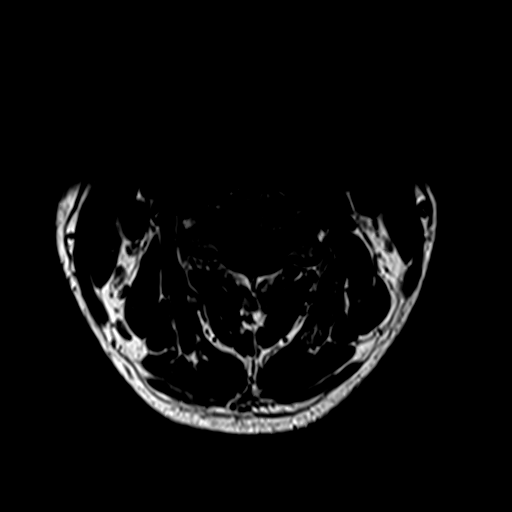
[im 21/29]
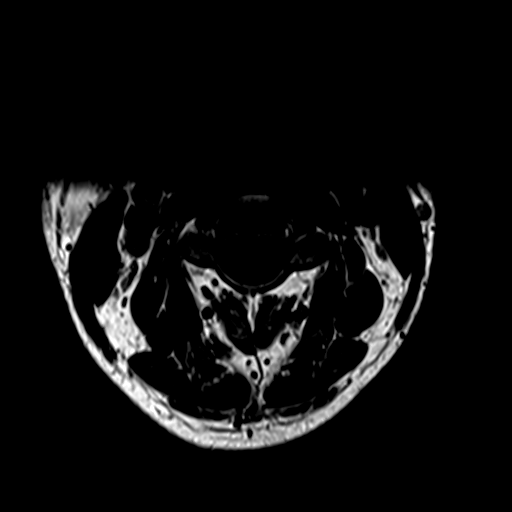
[im 25/29]
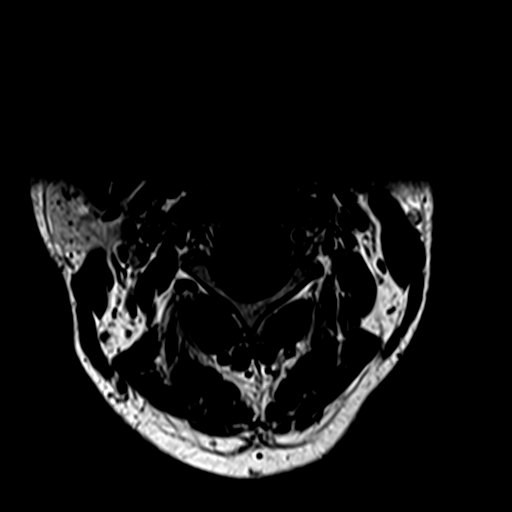
[im 29/29]
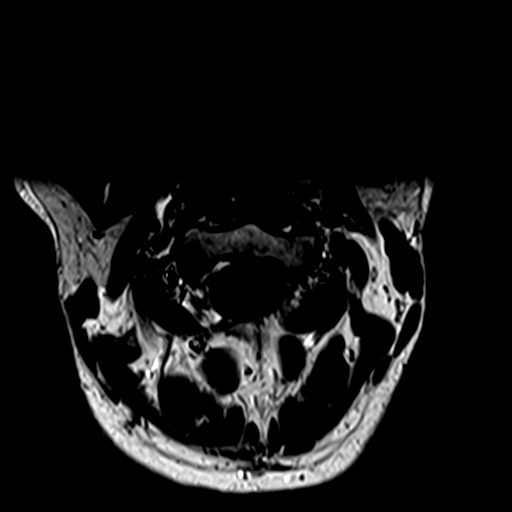

[Series 12: T1 post-contrast · axial · 3.0mm · 0.35mm/px · z∈[-205,-165]mm · 4 of 29 slices shown]
[im 1/29]
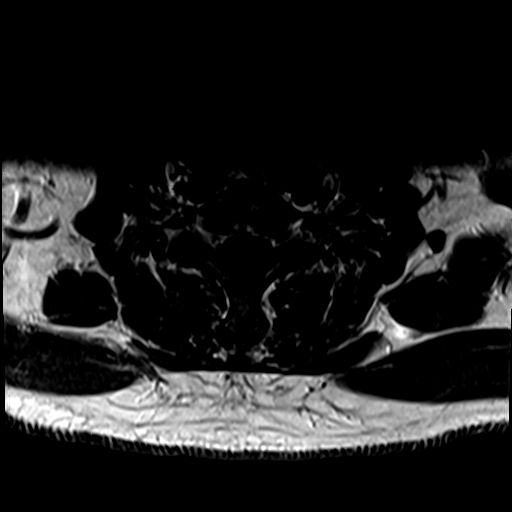
[im 5/29]
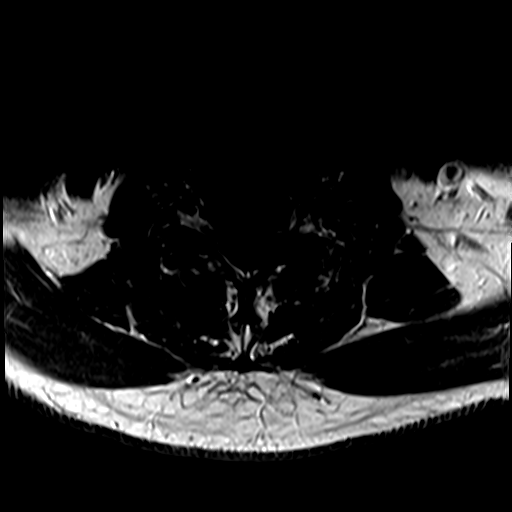
[im 9/29]
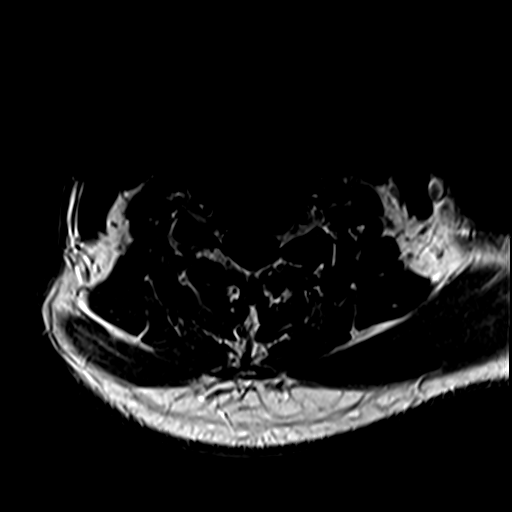
[im 13/29]
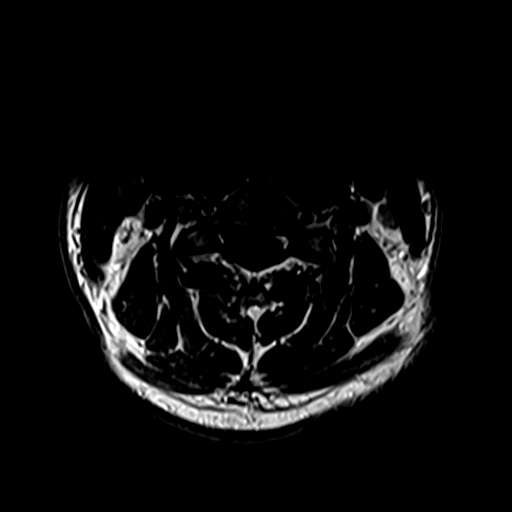

[28 of 48 positions shown; findings below may reference images not displayed]

FINDINGS: Alignment: Straightening of the expected cervical lordosis. No
significant spondylolisthesis.

Vertebrae: No significant marrow edema or focal suspicious osseous
lesion.

Cord: No signal abnormality identified within the cervical spinal
cord. No abnormal spinal cord enhancement.

Posterior Fossa, vertebral arteries, paraspinal tissues: Posterior
fossa better assessed on same-day brain MRI. Flow voids preserved
within the imaged cervical vertebral arteries. Paraspinal soft
tissues unremarkable.

Disc levels:

No more than mild disc degeneration within the cervical spine.

C2-C3: Mild uncovertebral hypertrophy on the right. No significant
disc herniation or stenosis.

C3-C4: Slight disc bulge. No significant spinal canal or foraminal
stenosis.

C4-C5: Slight disc bulge. Mild facet arthrosis (predominantly on the
right). No significant spinal canal or foraminal stenosis.

C5-C6: Shallow broad-based central disc protrusion. Uncovertebral
hypertrophy on the right. Mild facet arthrosis (greater on the
right). The disc protrusion results in mild focal effacement of the
ventral thecal sac (without spinal cord mass effect). Uncovertebral
hypertrophy results in mild relative right neural foraminal
narrowing.

C6-C7: Slight disc bulge. No significant spinal canal or foraminal
stenosis.

C7-T1: This level is imaged in the sagittal plane only. No
significant disc herniation or stenosis.
IMPRESSION: Cervical spondylosis, as outlined and with findings most notably as
follows.

At C5-C6, a shallow broad-based central disc protrusion mildly
effaces the ventral thecal sac (without spinal cord mass effect).
Right-sided uncovertebral hypertrophy results in mild relative right
neural foraminal narrowing.

No significant spinal canal or foraminal stenosis at the remaining
levels.

Nonspecific straightening of the expected cervical lordosis.

No signal abnormality within the cervical spinal cord, or pathologic
spinal cord enhancement.

## 2022-03-12 IMAGING — MR MR MRA HEAD W/O CM
2 series · 27 of 48 positions shown · non-contrast
Comparison: Same-day brain MRI [DATE]. Same day cervical spine
MRI [DATE]. MRI brain [DATE].

CLINICAL DATA: Provided history: T2 hyperintense foci present in
cerebral cortex on magnetic resonance imaging. Additional history
provided: Tremor in right hand and arm, neuropathy, headaches,
falls, cognitive changes, unexplained fevers and bruising, possible
toxic exposure.

EXAM:
MRA HEAD WITHOUT CONTRAST
TECHNIQUE: Angiographic images of the Circle of Willis were acquired using MRA
technique without intravenous contrast.

[Series 1: TOF · axial · 0.5mm · 0.48mm/px · z∈[-55,+41]mm · 17 of 217 slices shown (1 of 2)]
[im 1/217]
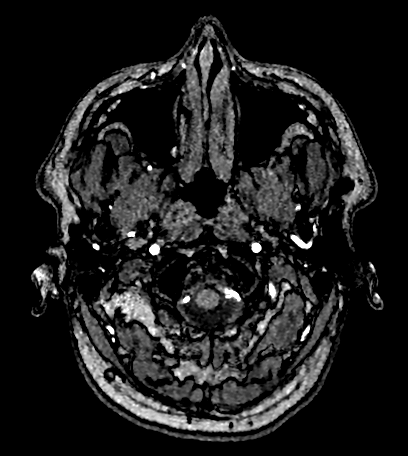
[im 10/217]
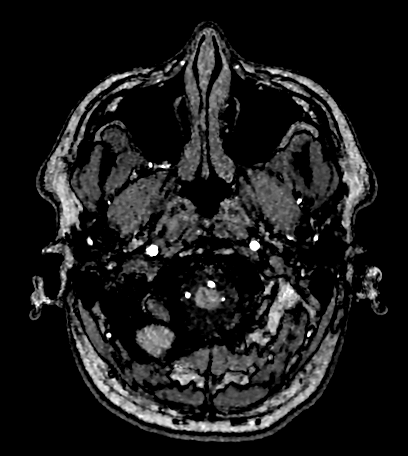
[im 19/217]
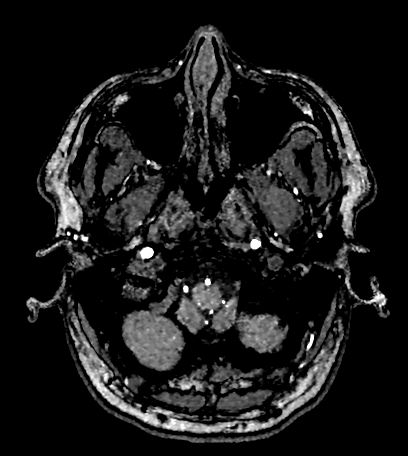
[im 29/217]
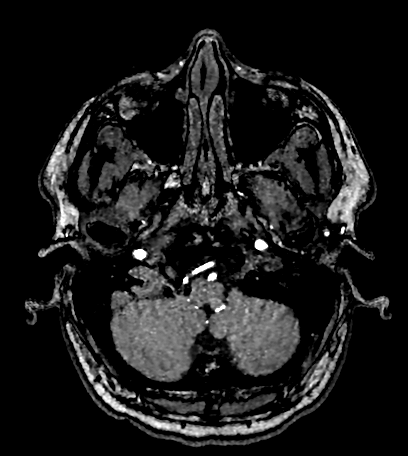
[im 38/217]
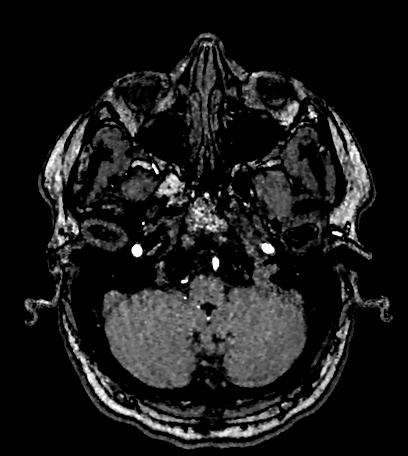
[im 47/217]
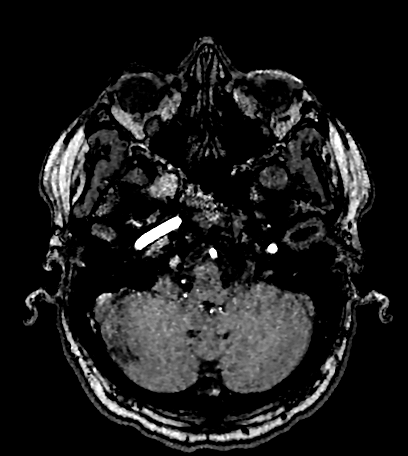
[im 57/217]
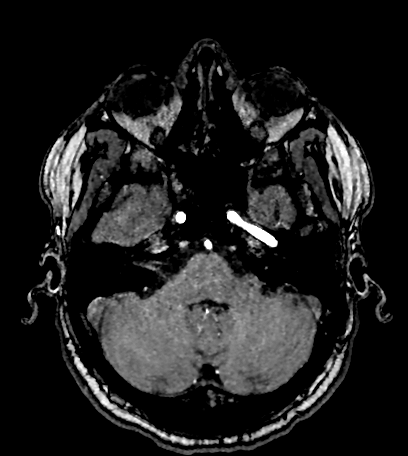
[im 66/217]
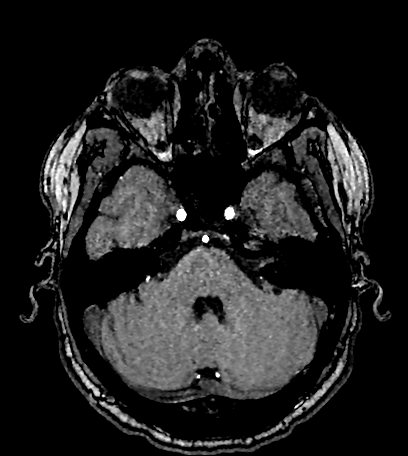
[im 76/217]
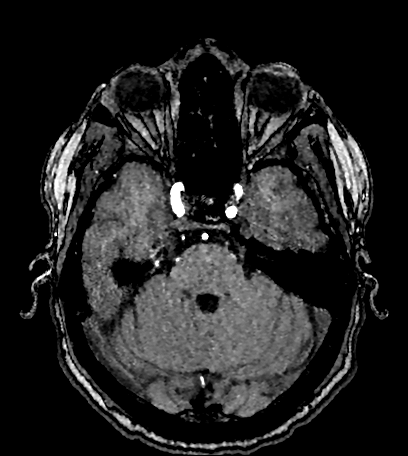
[im 85/217]
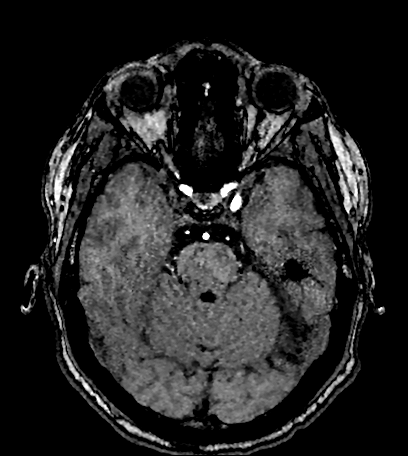
[im 94/217]
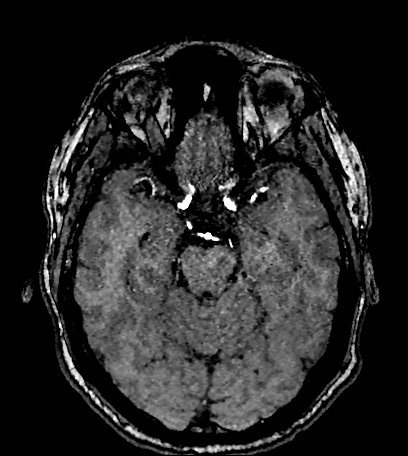
[im 113/217]
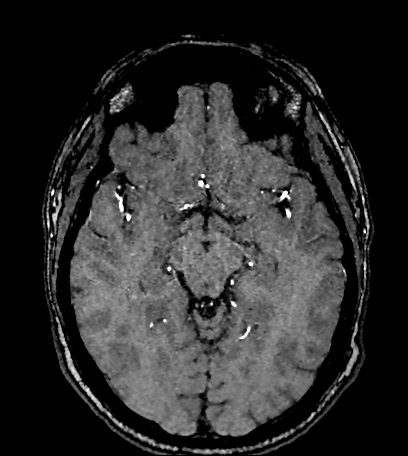
[im 123/217]
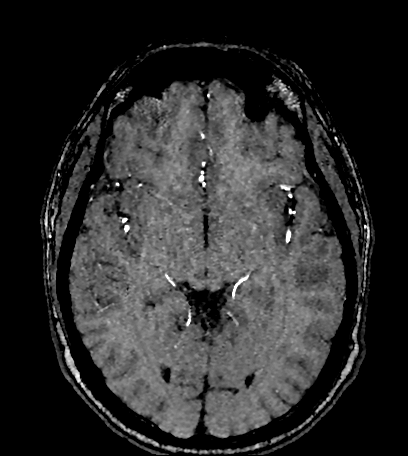
[im 151/217]
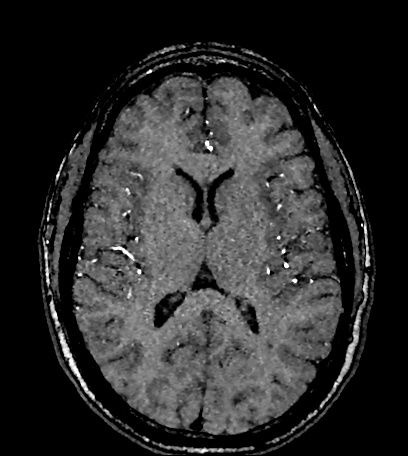
[im 179/217]
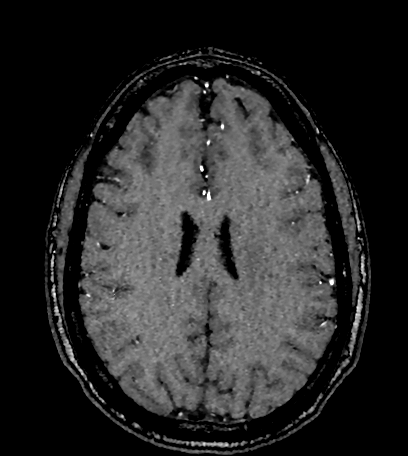
[im 188/217]
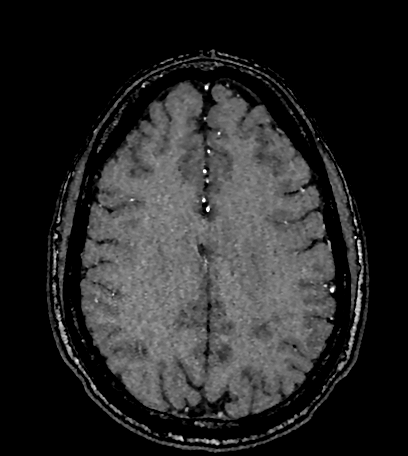
[im 207/217]
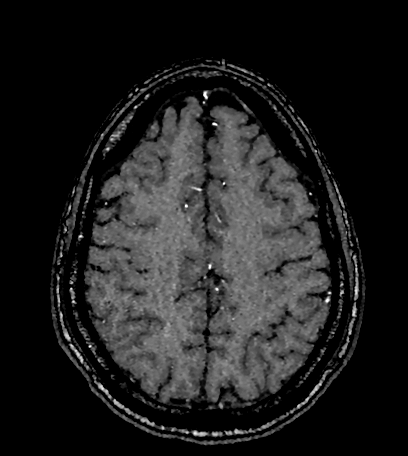

[Series 9: TOF · axial · 0.5mm · 0.48mm/px · z∈[-51,+41]mm · 10 of 217 slices shown (2 of 2)]
[im 10/217]
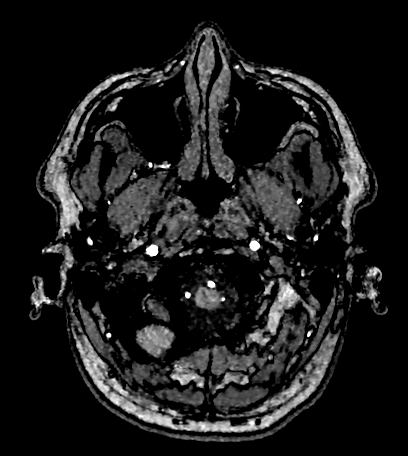
[im 38/217]
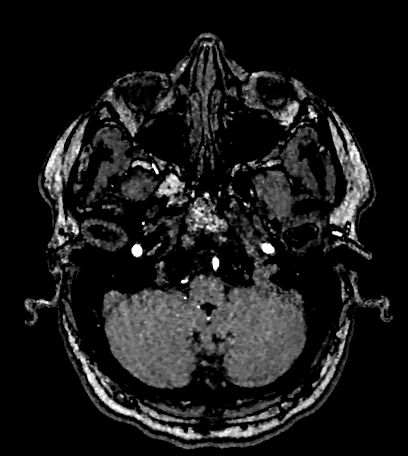
[im 66/217]
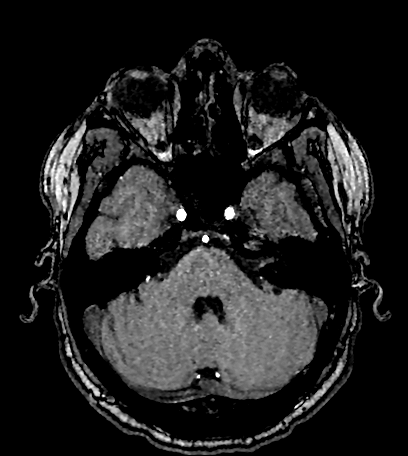
[im 94/217]
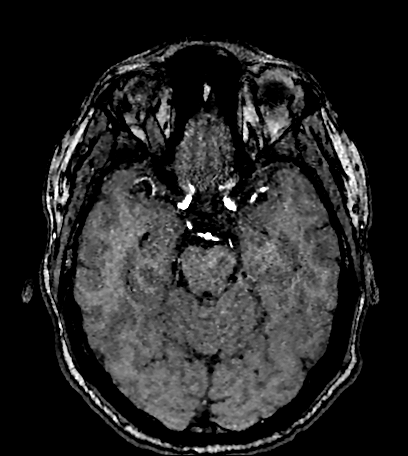
[im 113/217]
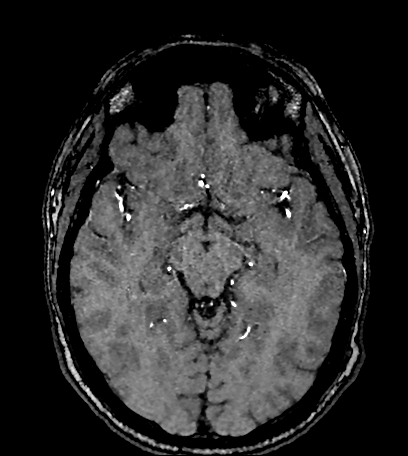
[im 123/217]
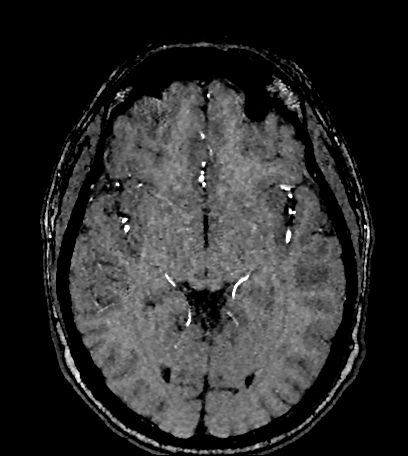
[im 151/217]
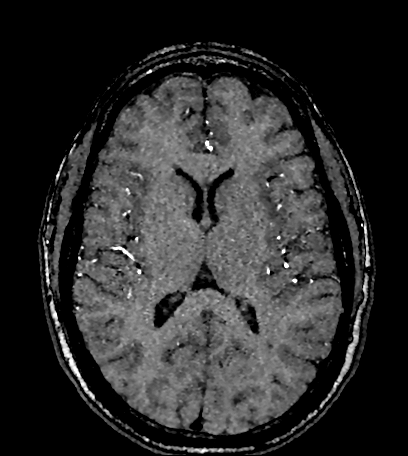
[im 179/217]
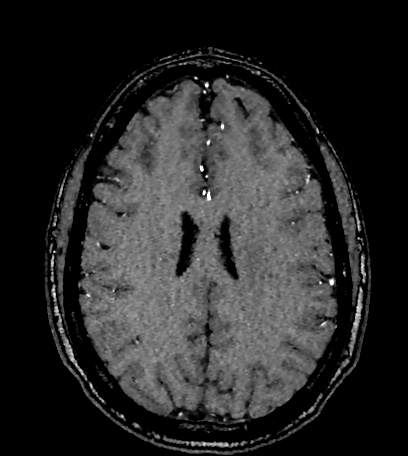
[im 188/217]
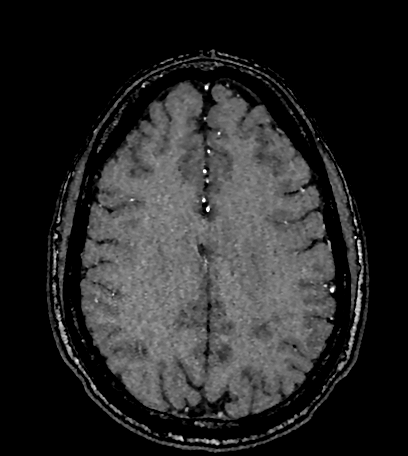
[im 207/217]
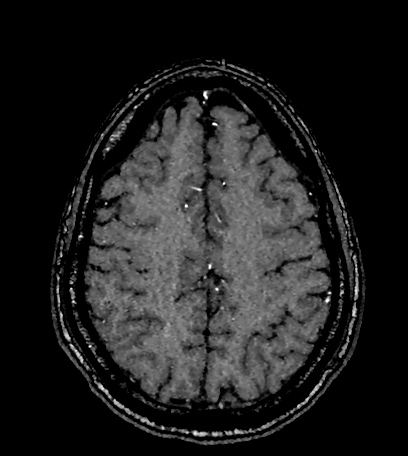

[27 of 48 positions shown; findings below may reference images not displayed]

FINDINGS: Anterior circulation:

The intracranial internal carotid arteries are patent. The M1 middle
cerebral arteries are patent. No M2 proximal branch occlusion or
high-grade proximal stenosis. The anterior cerebral arteries are
patent. No intracranial aneurysm is identified.

Posterior circulation:

The intracranial vertebral arteries are patent. The basilar artery
is patent. The posterior cerebral arteries are patent. A right
posterior communicating artery is present. The left posterior
communicating artery is diminutive or absent.

Anatomic variants: As described.
IMPRESSION: No intracranial large vessel occlusion or proximal high-grade
arterial stenosis.

## 2022-03-12 IMAGING — MR MR HEAD WO/W CM
13 series · 48 of 48 positions shown · IV contrast (gadavist)
Comparison: Same day MRA head [DATE]. Same day MRI cervical
spine [DATE]. MRI brain [DATE].

CLINICAL DATA: Provided history: T2 hyperintense foci present in
cerebral cortex on magnetic resonance imaging. Additional history
provided by scanning technologist: Patient reports tremor in right
hand and arm, neuropathy, falls, headaches, cognitive changes,
unexplained fevers and bruising, possible toxic exposure.

EXAM:
MRI HEAD WITHOUT AND WITH CONTRAST
TECHNIQUE: Multiplanar, multiecho pulse sequences of the brain and surrounding
structures were obtained without and with intravenous contrast.
CONTRAST:  10mL GADAVIST GADOBUTROL 1 MMOL/ML IV SOLN

[Series 5: ax dwi_tracew · axial · 3.0mm · 0.65mm/px · z∈[-60,+95]mm · 3 of 48 slices shown]
[im 1/48]
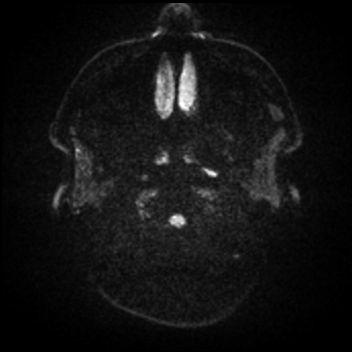
[im 24/48]
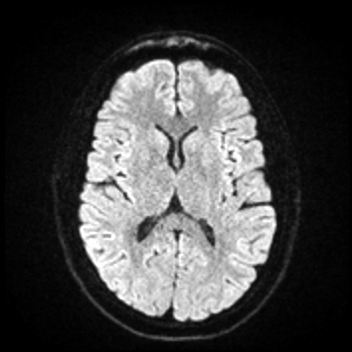
[im 48/48]
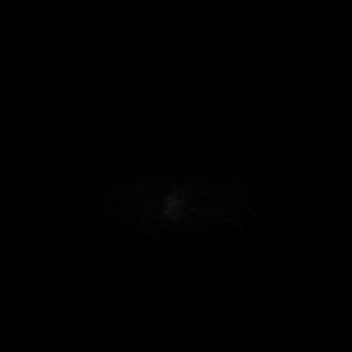

[Series 6: ax dwi_adc · axial · 3.0mm · 0.65mm/px · z∈[-60,+91]mm · 3 of 47 slices shown]
[im 1/47]
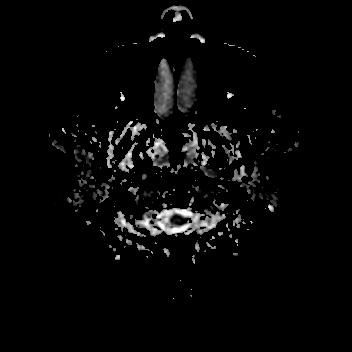
[im 24/47]
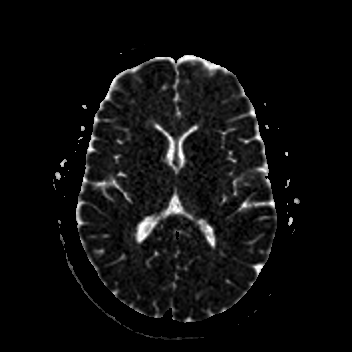
[im 47/47]
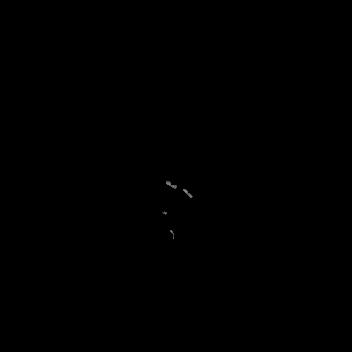

[Series 7: cor dwi_tracew · coronal · 5.0mm · 0.68mm/px · 2 of 40 slices shown]
[im 1/40]
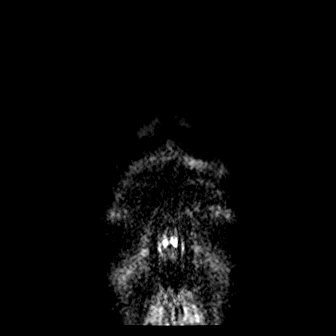
[im 40/40]
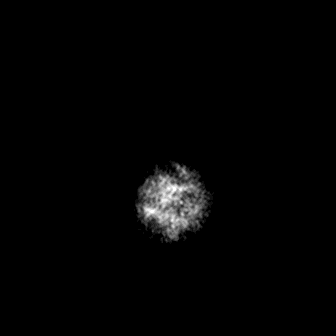

[Series 8: cor dwi_adc · coronal · 5.0mm · 0.68mm/px · 2 of 40 slices shown]
[im 1/40]
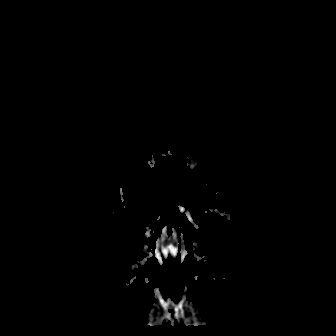
[im 40/40]
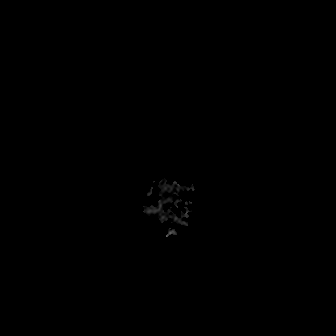

[Series 14: T1 · sagittal · 5.0mm · 0.62mm/px · 1 of 23 slices shown (1 of 2)]
[im 1/23]
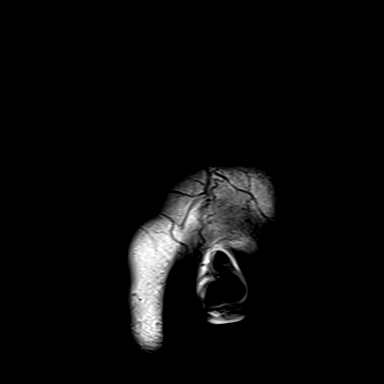

[Series 15: T2 · axial · 5.0mm · 0.53mm/px · z∈[-64,+92]mm · 2 of 27 slices shown]
[im 1/27]
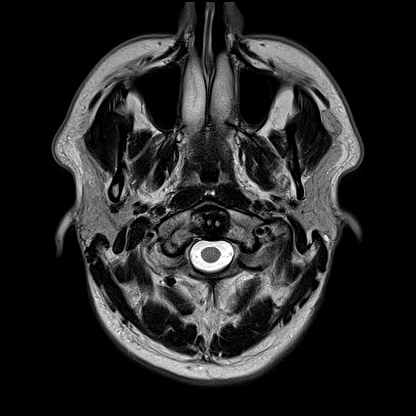
[im 27/27]
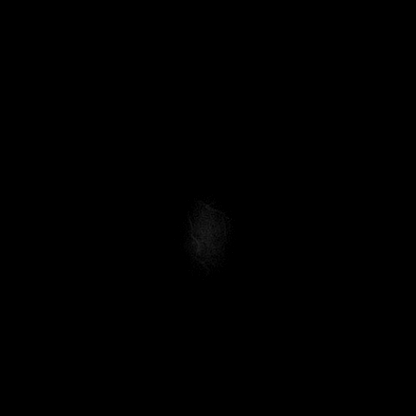

[Series 17: ax swi_pha · axial · 2.0mm · 0.90mm/px · z∈[-65,+93]mm · 5 of 80 slices shown]
[im 1/80]
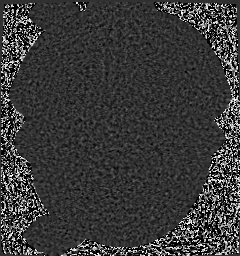
[im 20/80]
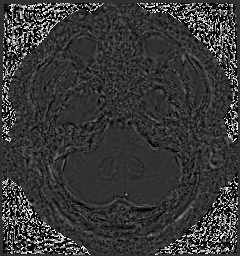
[im 40/80]
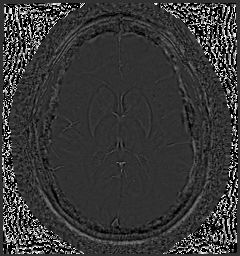
[im 60/80]
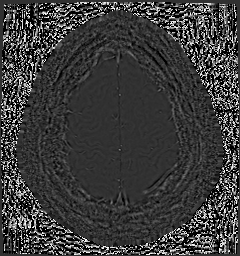
[im 80/80]
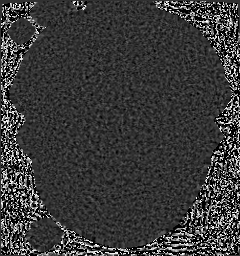

[Series 18: ax swi_swi · axial · 2.0mm · 0.90mm/px · z∈[-65,+93]mm · 5 of 80 slices shown]
[im 1/80]
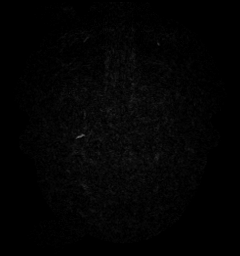
[im 20/80]
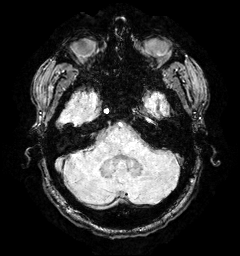
[im 40/80]
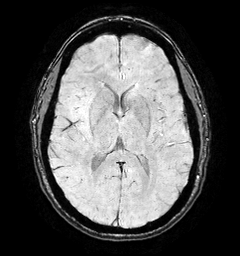
[im 60/80]
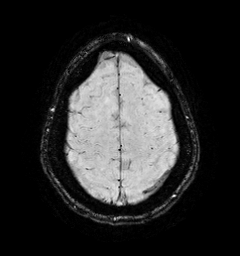
[im 80/80]
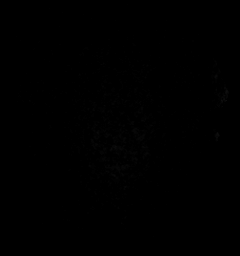

[Series 20: FLAIR · axial · 3.0mm · 0.53mm/px · z∈[-67,+95]mm · 3 of 55 slices shown]
[im 1/55]
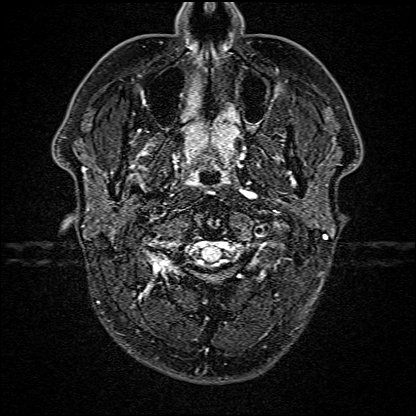
[im 28/55]
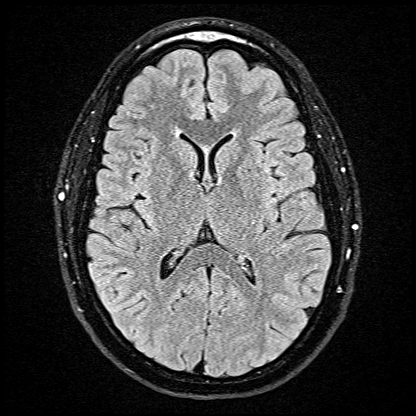
[im 55/55]
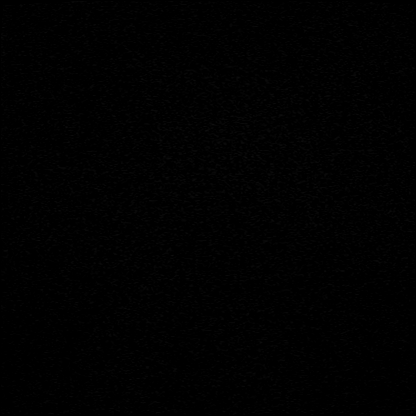

[Series 21: T1 · axial · 1.0mm · 0.98mm/px · z∈[-65,+94]mm · 9 of 160 slices shown (2 of 2)]
[im 1/160]
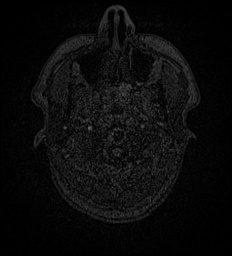
[im 20/160]
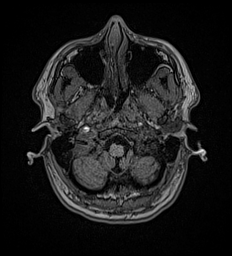
[im 40/160]
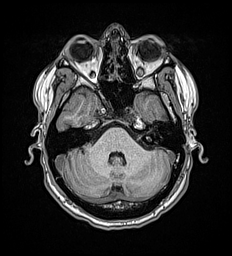
[im 60/160]
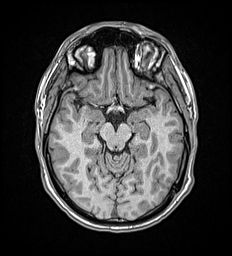
[im 80/160]
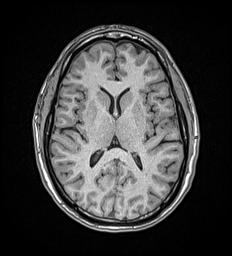
[im 100/160]
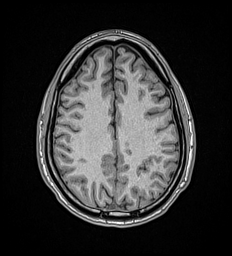
[im 120/160]
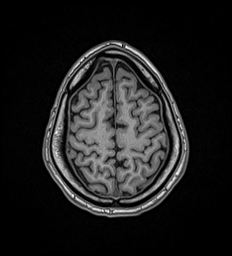
[im 140/160]
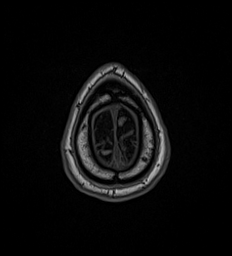
[im 160/160]
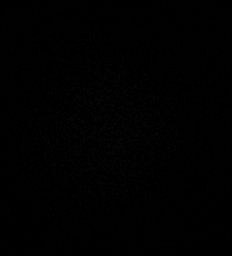

[Series 22: T2 post-contrast · coronal · 5.0mm · 0.57mm/px · 2 of 30 slices shown]
[im 1/30]
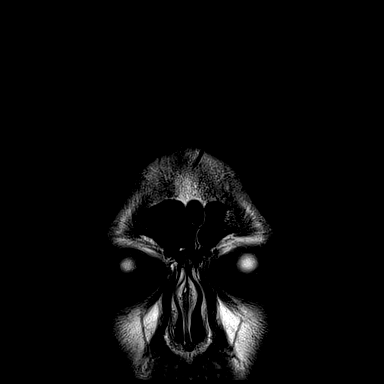
[im 30/30]
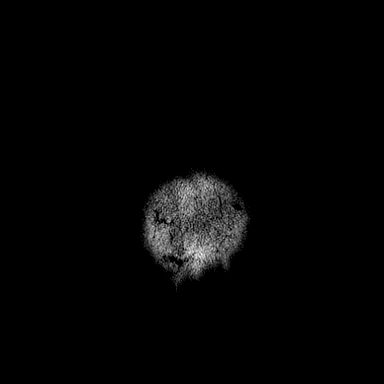

[Series 23: T1 post-contrast · axial · 1.0mm · 0.98mm/px · z∈[-65,+94]mm · 9 of 160 slices shown (1 of 2)]
[im 1/160]
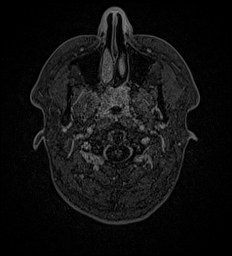
[im 20/160]
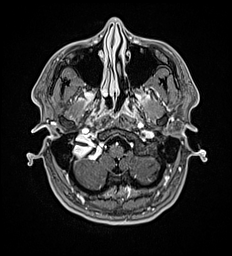
[im 40/160]
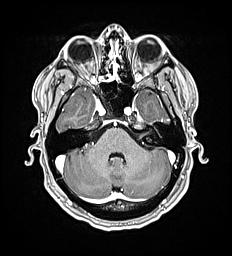
[im 60/160]
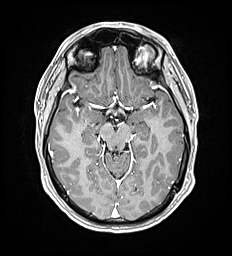
[im 80/160]
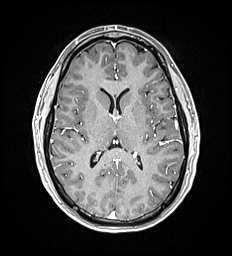
[im 100/160]
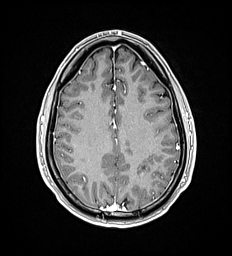
[im 120/160]
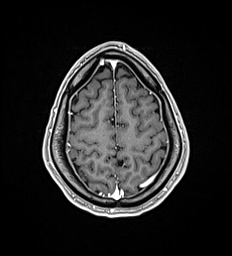
[im 140/160]
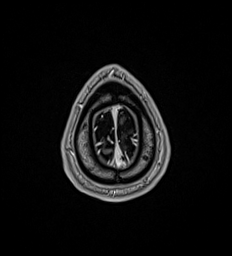
[im 160/160]
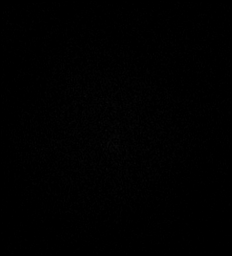

[Series 24: T1 post-contrast · coronal · 5.0mm · 0.57mm/px · 2 of 30 slices shown (2 of 2)]
[im 1/30]
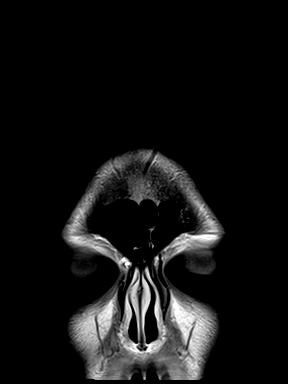
[im 30/30]
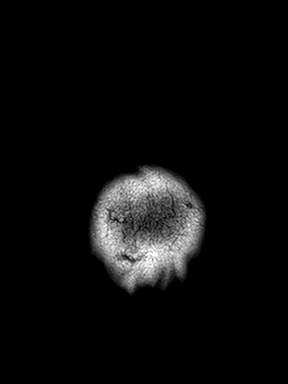

[48 of 48 positions shown; findings below may reference images not displayed]

FINDINGS: Brain:

Cerebral volume is normal.

Multifocal T2 FLAIR hyperintense signal abnormality within the
cerebral white matter (greatest in the frontal lobes), overall mild
but unexpected in a patient of this age. These signal changes are
stable from the prior brain MRI of [DATE].

No cortical encephalomalacia is identified.

There is no acute infarct.

No evidence of an intracranial mass.

No chronic intracranial blood products.

No extra-axial fluid collection.

No midline shift.

No pathologic intracranial enhancement identified.

Vascular: Maintained flow voids within the proximal large arterial
vessels.

Skull and upper cervical spine: No focal suspicious marrow lesion.

Sinuses/Orbits: No mass or acute finding within the imaged orbits.
Small mucous retention cysts within the right maxillary sinus. Mild
mucosal thickening within a posterior right ethmoid air cell.
IMPRESSION: 1. No evidence of acute intracranial abnormality.
2. Multifocal T2 FLAIR hyperintense signal abnormality within the
cerebral white matter, overall mild but unexpected in a patient of
this age. These signal changes are stable from the prior brain MRI
of [DATE]. Findings are nonspecific and differential
considerations include chronic small vessel ischemic disease,
sequela of chronic migraine headaches, sequela of a prior
infectious/inflammatory process or less likely (given the
distribution) sequela of a demyelinating process (such as multiple
sclerosis), among others.
3. Otherwise unremarkable MRI appearance of the brain.
4. Mild right ethmoid and maxillary sinus disease, as described.

## 2022-03-12 MED ORDER — GADOBUTROL 1 MMOL/ML IV SOLN
10.0000 mL | Freq: Once | INTRAVENOUS | Status: AC | PRN
Start: 2022-03-12 — End: 2022-03-12
  Administered 2022-03-12: 10 mL via INTRAVENOUS

## 2022-03-23 ENCOUNTER — Ambulatory Visit (INDEPENDENT_AMBULATORY_CARE_PROVIDER_SITE_OTHER): Payer: 59 | Admitting: Dermatology

## 2022-03-23 DIAGNOSIS — D485 Neoplasm of uncertain behavior of skin: Secondary | ICD-10-CM

## 2022-03-23 DIAGNOSIS — L910 Hypertrophic scar: Secondary | ICD-10-CM

## 2022-03-23 DIAGNOSIS — D225 Melanocytic nevi of trunk: Secondary | ICD-10-CM

## 2022-03-23 DIAGNOSIS — D229 Melanocytic nevi, unspecified: Secondary | ICD-10-CM

## 2022-03-23 DIAGNOSIS — L309 Dermatitis, unspecified: Secondary | ICD-10-CM | POA: Diagnosis not present

## 2022-03-23 DIAGNOSIS — L814 Other melanin hyperpigmentation: Secondary | ICD-10-CM | POA: Diagnosis not present

## 2022-03-23 DIAGNOSIS — D1800 Hemangioma unspecified site: Secondary | ICD-10-CM

## 2022-03-23 HISTORY — DX: Melanocytic nevi, unspecified: D22.9

## 2022-03-23 MED ORDER — EUCRISA 2 % EX OINT: TOPICAL_OINTMENT | CUTANEOUS | 1 refills | Status: AC

## 2022-03-23 NOTE — Progress Notes (Signed)
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  Patient here to follow-up from visit 2 months ago. Patient has noticed increased angiomas coming up on body. He is still having bruising on body and scratches/bites that are not healing quickly. 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   Patient has neuropathy, weakness, and joint pain. Lyme's and RMSF both negative. Patient has appointment in [place] next week for additional testing. Also has a brain MRI on 03/12/2022. Patient being followed by oncology, cardiology, hematology, and neurology.   The following portions of the chart were reviewed this encounter and updated as appropriate:       Review of Systems:  No other skin or systemic complaints except as noted in HPI or Assessment and Plan.  Objective  Well appearing patient in no apparent distress; mood and affect are within normal limits.  A focused examination was performed including face, arms, legs. Relevant physical exam findings are noted in the Assessment and Plan.  inferior umbilicus Firm violaceous nodule in area of previous surgical incision  hands Pink scaly macules ~ 1cm R index finger, L wrist; erythema of the bil MCPs and DIPs  left mid abdomen - A 5.0 mm med dark brown macule, darker center         Assessment & Plan  Lentigines - Scattered tan macules on the hands - Due to sun exposure - Benign-appering, observe - Recommend daily broad spectrum sunscreen SPF 30+ to sun-exposed areas, reapply every 2 hours as needed. - Call for any changes  Melanocytic Nevi - Tan-brown and/or pink-flesh-colored symmetric macules and papules - Benign appearing on exam today - Observation - Call clinic for new or changing moles - Recommend daily use of broad spectrum spf 30+ sunscreen to sun-exposed areas.  - Previous photos compared on abdomen, stable.    Dermatitis  Hypertrophic scar inferior umbilicus  Benign, observe.    Start Serica Gel - Apply to scar BID, info given.   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 Condition is bothersome/symptomatic for patient. Currently flared.   Recommend mild soap and moisturizing cream 1-2 times daily.  Gentle skin care handout provided.   Impoyz Cream Apply to AA hands twice a day until improved, samples x 2 given today. Avoid face, groin, axilla.  Start Eucrisa ointment bid to aas hands until rash cleared and prn recurrence   Hand Dermatitis is a chronic type of eczema that can come and go on the hands and fingers.  While there is no cure, the rash and symptoms can be managed with topical prescription medications, and for more severe cases, with systemic medications.  Recommend mild soap and routine use of moisturizing cream after handwashing.  Minimize soap/water exposure when possible.     Crisaborole (EUCRISA) 2 % OINT - hands Apply to affected areas hands once to twice daily as needed for rash.  Neoplasm of uncertain behavior of skin left mid abdomen - A  Epidermal / dermal shaving  Lesion diameter (cm):  0.8 Informed consent: discussed and consent obtained   Patient was prepped and draped in usual sterile fashion: Area prepped with alcohol. Anesthesia: the lesion was anesthetized in a standard fashion   Anesthetic:  1% lidocaine w/ epinephrine 1-100,000 buffered w/ 8.4% NaHCO3 Instrument used: flexible razor blade   Hemostasis achieved with: pressure, aluminum chloride and  electrodesiccation   Outcome: patient tolerated procedure well   Post-procedure details: wound care instructions given   Post-procedure details comment:  Ointment and small bandage applied  Specimen 1 - Surgical pathology Differential Diagnosis: Nevus r/o Dysplasia Check Margins: Yes 5.0 mm med dark brown macule, darker  center  Hemangiomas - Red papules - Discussed benign nature - Observe - Call for any changes  Melanocytic Nevi - Tan-brown and/or pink-flesh-colored symmetric macules and papules - Benign appearing on exam today - Observation - Call clinic for new or changing moles - Recommend daily use of broad spectrum spf 30+ sunscreen to sun-exposed areas.    Return as scheduled, for TBSE.  IJamesetta Orleans, CMA, am acting as scribe for Brendolyn Patty, MD . Documentation: I have reviewed the above documentation for accuracy and completeness, and I agree with the above.  Brendolyn Patty MD

## 2022-03-23 NOTE — Patient Instructions (Addendum)
Impoyz Cream - Apply twice a day to pink, scaly areas on hands until improved. Samples given today.  Serica Gel (OTC) - Apply to scar on umbilicus twice a day.   Wound Care Instructions  Cleanse wound gently with soap and water once a day then pat dry with clean gauze. Apply a thing coat of Petrolatum (petroleum jelly, "Vaseline") over the wound (unless you have an allergy to this). We recommend that you use a new, sterile tube of Vaseline. Do not pick or remove scabs. Do not remove the yellow or white "healing tissue" from the base of the wound.  Cover the wound with fresh, clean, nonstick gauze and secure with paper tape. You may use Band-Aids in place of gauze and tape if the would is small enough, but would recommend trimming much of the tape off as there is often too much. Sometimes Band-Aids can irritate the skin.  You should call the office for your biopsy report after 1 week if you have not already been contacted.  If you experience any problems, such as abnormal amounts of bleeding, swelling, significant bruising, significant pain, or evidence of infection, please call the office immediately.  FOR ADULT SURGERY PATIENTS: If you need something for pain relief you may take 1 extra strength Tylenol (acetaminophen) AND 2 Ibuprofen ('200mg'$  each) together every 4 hours as needed for pain. (do not take these if you are allergic to them or if you have a reason you should not take them.) Typically, you may only need pain medication for 1 to 3 days.    Hand Dermatitis is a chronic type of eczema that can come and go on the hands and fingers.  While there is no cure, the rash and symptoms can be managed with topical prescription medications, and for more severe cases, with systemic medications.  Recommend mild soap and routine use of moisturizing cream after handwashing.  Minimize soap/water exposure when possible.      Gentle Skin Care Guide  1. Bathe no more than once a day.  2. Avoid bathing  in hot water  3. Use a mild soap like Dove, Vanicream, Cetaphil, CeraVe. Can use Lever 2000 or Cetaphil antibacterial soap  4. Use soap only where you need it. On most days, use it under your arms, between your legs, and on your feet. Let the water rinse other areas unless visibly dirty.  5. When you get out of the bath/shower, use a towel to gently blot your skin dry, don't rub it.  6. While your skin is still a little damp, apply a moisturizing cream such as Vanicream, CeraVe, Cetaphil, Eucerin, Sarna lotion or plain Vaseline Jelly. For hands apply Neutrogena [place] Hand Cream or Excipial Hand Cream.  7. Reapply moisturizer any time you start to itch or feel dry.  8. Sometimes using free and clear laundry detergents can be helpful. Fabric softener sheets should be avoided. Downy Free & Gentle liquid, or any liquid fabric softener that is free of dyes and perfumes, it acceptable to use  9. If your doctor has given you prescription creams you may apply moisturizers over them   Melanoma ABCDEs  Melanoma is the most dangerous type of skin cancer, and is the leading cause of death from skin disease.  You are more likely to develop melanoma if you: Have light-colored skin, light-colored eyes, or red or blond hair Spend a lot of time in the sun Tan regularly, either outdoors or in a tanning bed Have had blistering sunburns,  especially during childhood Have a close family member who has had a melanoma Have atypical moles or large birthmarks  Early detection of melanoma is key since treatment is typically straightforward and cure rates are extremely high if we catch it early.   The first sign of melanoma is often a change in a mole or a new dark spot.  The ABCDE system is a way of remembering the signs of melanoma.  A for asymmetry:  The two halves do not match. B for border:  The edges of the growth are irregular. C for color:  A mixture of colors are present instead of an even brown  color. D for diameter:  Melanomas are usually (but not always) greater than 38m - the size of a pencil eraser. E for evolution:  The spot keeps changing in size, shape, and color.  Please check your skin once per month between visits. You can use a small mirror in front and a large mirror behind you to keep an eye on the back side or your body.   If you see any new or changing lesions before your next follow-up, please call to schedule a visit.  Please continue daily skin protection including broad spectrum sunscreen SPF 30+ to sun-exposed areas, reapplying every 2 hours as needed when you're outdoors.   Staying in the shade or wearing long sleeves, sun glasses (UVA+UVB protection) and wide brim hats (4-inch brim around the entire circumference of the hat) are also recommended for sun protection.    Due to recent changes in healthcare laws, you may see results of your pathology and/or laboratory studies on MyChart before the doctors have had a chance to review them. We understand that in some cases there may be results that are confusing or concerning to you. Please understand that not all results are received at the same time and often the doctors may need to interpret multiple results in order to provide you with the best plan of care or course of treatment. Therefore, we ask that you please give u[place]2 business days to thoroughly review all your results before contacting the office for clarification. Should we see a critical lab result, you will be contacted sooner.   If You Need Anything After Your Visit  If you have any questions or concerns for your doctor, please call our main line at [phone] press option 4 to reach your doctor's medical assistant. If no one answers, please leave a voicemail as directed and we will return your call as soon as possible. Messages left after 4 pm will be answered the following business day.   You may also send [place] message via [place] We typically  respond to MyChart messages within 1-2 business days.  For prescription refills, please ask your pharmacy to contact our office. Our fax number is [phone]  If you have an urgent issue when the clinic is closed that cannot wait until the next business day, you can page your doctor at the number below.    Please note that while we do our best to be available for urgent issues outside of office hours, we are not available 24/7.   If you have an urgent issue and are unable to reach u[place] you may choose to seek medical care at your doctor's office, retail clinic, urgent care center, or emergency room.  If you have a medical emergency, please immediately call 911 or go to the emergency department.  Pager Numbers  - Dr. KNehemiah Massed [phone] -  Dr. Laurence Ferrari: [phone]  - Dr. Nicole Kindred: [phone]  In the event of inclement weather, please call our main line at [phone] for an update on the status of any delays or closures.  Dermatology Medication Tips: Please keep the boxes that topical medications come in in order to help keep track of the instructions about where and how to use these. Pharmacies typically print the medication instructions only on the boxes and not directly on the medication tubes.   If your medication is too expensive, please contact our office at [phone] option 4 or send [place] a message through [place].   We are unable to tell what your co-pay for medications will be in advance as this is different depending on your insurance coverage. However, we may be able to find a substitute medication at lower cost or fill out paperwork to get insurance to cover a needed medication.   If a prior authorization is required to get your medication covered by your insurance company, please allow [place] 1-2 business days to complete this process.  Drug prices often vary depending on where the prescription is filled and some pharmacies may offer cheaper prices.  The website  www.goodrx.com contains coupons for medications through different pharmacies. The prices here do not account for what the cost may be with help from insurance (it may be cheaper with your insurance), but the website can give you the price if you did not use any insurance.  - You can print the associated coupon and take it with your prescription to the pharmacy.  - You may also stop by our office during regular business hours and pick up a GoodRx coupon card.  - If you need your prescription sent electronically to a different pharmacy, notify our office through [facility] or by phone at [phone] option 4.     Si Usted Necesita Algo Despus de Su Visita  Tambin puede enviarnos un mensaje a travs de Pharmacist, community. Por lo general respondemos a los mensajes de MyChart en el transcurso de 1 a 2 das hbiles.  Para renovar recetas, por favor pida a su farmacia que se ponga en contacto con nuestra oficina. Harland Dingwall de fax es [phone].  Si tiene un asunto urgente cuando la clnica est cerrada y que no puede esperar hasta el siguiente da hbil, puede llamar/localizar a su doctor(a) al nmero que aparece a continuacin.   Por favor, tenga en cuenta que aunque hacemos todo lo posible para estar disponibles para asuntos urgentes fuera del horario de [place], no estamos disponibles las 24 horas del da, los 7 das de la [place].   Si tiene un problema urgente y no puede comunicarse con nosotros, puede optar por buscar atencin mdica  en el consultorio de su doctor(a), en una clnica privada, en un centro de atencin urgente o en una sala de emergencias.  Si tiene Engineering geologist, por favor llame inmediatamente al 911 o vaya a la sala de emergencias.  Nmeros de bper  - Dr. Nehemiah Massed: [phone]  - Dra. Moye: [phone]  - Dra. Nicole Kindred: [phone]  En caso de inclemencias del [place], por favor llame a Johnsie Kindred principal al [phone] para una actualizacin  sobre el [place] de cualquier retraso o cierre.  Consejos para la medicacin en dermatologa: Por favor, guarde las cajas en las que vienen los medicamentos de uso tpico para ayudarle a seguir las instrucciones sobre dnde y cmo usarlos. Las farmacias generalmente imprimen las instrucciones del medicamento slo en las cajas y  no directamente en los tubos del medicamento.   Si su medicamento es muy caro, por favor, pngase en contacto con Zigmund Daniel llamando al [phone] y presione la opcin 4 o envenos un mensaje a travs de Pharmacist, community.   No podemos decirle cul ser su copago por los medicamentos por adelantado ya que esto es diferente dependiendo de la cobertura de su seguro. Sin embargo, es posible que podamos encontrar un medicamento sustituto a Electrical engineer un formulario para que el seguro cubra el medicamento que se considera necesario.   Si se requiere una autorizacin previa para que su compaa de seguros [place] su medicamento, por favor permtanos de 1 a 2 das hbiles para completar este proceso.  Los precios de los medicamentos varan con frecuencia dependiendo del Environmental consultant de dnde se surte la receta y alguna farmacias pueden ofrecer precios ms baratos.  El sitio web www.goodrx.com tiene cupones para medicamentos de Airline pilot. Los precios aqu no tienen en cuenta lo que podra costar con la ayuda del seguro (puede ser ms barato con su seguro), pero el sitio web puede darle el precio si no utiliz Research scientist (physical sciences).  - Puede imprimir el cupn correspondiente y llevarlo con su receta a la farmacia.  - Tambin puede pasar por nuestra oficina durante el horario de atencin regular y Charity fundraiser una tarjeta de cupones de GoodRx.  - Si necesita que su receta se enve electrnicamente a una farmacia diferente, informe a nuestra oficina a travs de MyChart de [place] o por telfono llamando al [phone] y presione la opcin 4.

## 2022-03-25 ENCOUNTER — Telehealth: Payer: Self-pay

## 2022-03-25 NOTE — Telephone Encounter (Signed)
-----   Message from Brendolyn Patty, MD sent at 03/24/2022  7:19 PM EDT ----- Skin , left mid abdomen DYSPLASTIC NEVUS WITH MODERATE TO SEVERE ATYPIA, IRRITATED WITH FOCAL SCAR, CLOSE TO MARGIN  Moderately to severely atypical mole, needs repeat shave removal   - please call patient

## 2022-03-25 NOTE — Telephone Encounter (Signed)
Advised pt of bx results.  Pt is leaving for [facility] and is unsure when he will return.  He would like a copy of pathology and a copy of Dr. Marguerite Olea result note comments to take with him so he can have treated while he is gone.  Patient asked about a TBSE, advised we could do on his follow up appointment or he can call when he returns to [facility] and we can schedule sooner.  Advised pt I would leave the copies of pathology and result note comments at front desk./sh

## 2022-04-05 ENCOUNTER — Telehealth: Payer: Self-pay

## 2022-04-05 ENCOUNTER — Encounter: Payer: Self-pay | Admitting: Oncology

## 2022-04-05 NOTE — Telephone Encounter (Signed)
Per Dr. Tasia Catchings advises patient to discuss the referral with PCP. They can refer him.

## 2022-04-08 NOTE — Telephone Encounter (Signed)
Patient requested referral sent to [facility] center in [place]. I have faxed Dr. Collie Siad last encounter note to provided fax number.

## 2022-05-28 ENCOUNTER — Encounter: Payer: Self-pay | Admitting: Urology

## 2022-07-12 ENCOUNTER — Telehealth: Payer: Self-pay

## 2022-07-12 NOTE — Telephone Encounter (Signed)
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  He is needing a EMG and a nerve biopsy. He would like to get Dr.Yarbrough's opinion. Would this be something that we treat?

## 2022-07-13 NOTE — Telephone Encounter (Signed)
Left message to return call. Will also send mychart message.

## 2022-09-06 ENCOUNTER — Ambulatory Visit: Payer: BC Managed Care – PPO | Admitting: Dermatology

## 2022-10-06 ENCOUNTER — Other Ambulatory Visit: Payer: Self-pay | Admitting: Cardiovascular Disease

## 2022-10-06 ENCOUNTER — Encounter: Payer: Self-pay | Admitting: Internal Medicine

## 2022-10-06 DIAGNOSIS — I41 Myocarditis in diseases classified elsewhere: Secondary | ICD-10-CM

## 2022-12-14 ENCOUNTER — Other Ambulatory Visit: Payer: Self-pay

## 2022-12-14 ENCOUNTER — Encounter: Payer: Self-pay | Admitting: Dermatology

## 2022-12-14 MED ORDER — QBREXZA 2.4 % EX PADS: 1.0000 | MEDICATED_PAD | Freq: Every day | CUTANEOUS | 3 refills | Status: AC

## 2022-12-14 NOTE — Progress Notes (Signed)
Seth Calderon to [place]

## 2022-12-29 ENCOUNTER — Telehealth: Payer: Self-pay

## 2022-12-29 NOTE — Telephone Encounter (Signed)
Patient has been trying to get his Obrexza pads but this is a plan exclusion at this time. Medicaid does not have any hyperhidroses medications on formulary.  EPIC shows he has [place] but per patient at this time he only has Medicaid coverage.

## 2022-12-30 MED ORDER — DRYSOL 20 % EX SOLN: Freq: Every day | CUTANEOUS | 0 refills | Status: AC

## 2022-12-30 NOTE — Telephone Encounter (Signed)
RX sent in and patient advised. aw ?

## 2023-03-02 ENCOUNTER — Ambulatory Visit: Payer: BC Managed Care – PPO | Admitting: Dermatology

## 2023-04-06 ENCOUNTER — Ambulatory Visit: Payer: Medicaid Other | Admitting: Dermatology

## 2023-04-06 DIAGNOSIS — L74510 Primary focal hyperhidrosis, axilla: Secondary | ICD-10-CM | POA: Diagnosis not present

## 2023-04-06 DIAGNOSIS — D235 Other benign neoplasm of skin of trunk: Secondary | ICD-10-CM

## 2023-04-06 DIAGNOSIS — D239 Other benign neoplasm of skin, unspecified: Secondary | ICD-10-CM

## 2023-04-06 DIAGNOSIS — L74519 Primary focal hyperhidrosis, unspecified: Secondary | ICD-10-CM

## 2023-04-06 DIAGNOSIS — L74511 Primary focal hyperhidrosis, face: Secondary | ICD-10-CM | POA: Diagnosis not present

## 2023-04-06 DIAGNOSIS — D229 Melanocytic nevi, unspecified: Secondary | ICD-10-CM

## 2023-04-06 MED ORDER — GLYCOPYRROLATE 1 MG PO TABS
ORAL_TABLET | ORAL | 5 refills | Status: DC
Start: 2023-04-06 — End: 2023-08-16

## 2023-04-06 MED ORDER — QBREXZA 2.4 % EX PADS
MEDICATED_PAD | CUTANEOUS | 5 refills | Status: DC
Start: 2023-04-06 — End: 2023-08-16

## 2023-04-06 NOTE — Patient Instructions (Addendum)
Start glycopyrrolate 1 mg daily for 3 days. If you do not have side effects and are still having excess sweating, increase by 1 mg per day every 3 days up to a maximum of 4 tablets (4 mg) in one day. You will spread out the medication through the day. For example, you will increase from 1 tablet daily to 1 tablet twice daily and then 1 tablet three times daily. If you still do not have good control of sweating and have no side effects, increase to 2 tablets in the morning and one tablet two more times during the day.  DO NOT take more than 4 tablets per day.   Increase dose SLOWLY only as directed.   Be cautious using this medication in the heat as it can significantly increase the risk of heat stroke. It can also cause dry mouth, blurred vision, difficulty with urination, headache, constipation, and racing heart.   Due to recent changes in healthcare laws, you may see results of your pathology and/or laboratory studies on MyChart before the doctors have had a chance to review them. We understand that in some cases there may be results that are confusing or concerning to you. Please understand that not all results are received at the same time and often the doctors may need to interpret multiple results in order to provide you with the best plan of care or course of treatment. Therefore, we ask that you please give [place] 2 business days to thoroughly review all your results before contacting the office for clarification. Should we see a critical lab result, you will be contacted sooner.   If You Need Anything After Your Visit  If you have any questions or concerns for your doctor, please call our main line at [phone] and press option 4 to reach your doctor's medical assistant. If no one answers, please leave a voicemail as directed and we will return your call as soon as possible. Messages left after 4 pm will be answered the following business day.   You may also send [place] a message via MyChart. We  typically respond to MyChart messages within 1-2 business days.  For prescription refills, please ask your pharmacy to contact our office. Our fax number is [phone].  If you have an urgent issue when the clinic is closed that cannot wait until the next business day, you can page your doctor at the number below.    Please note that while we do our best to be available for urgent issues outside of office hours, we are not available 24/7.   If you have an urgent issue and are unable to reach [place], you may choose to seek medical care at your doctor's office, retail clinic, urgent care center, or emergency room.  If you have a medical emergency, please immediately call 911 or go to the emergency department.  Pager Numbers  - Dr. Gwen Pounds: [phone]  - Dr. Neale Burly: [phone]  - Dr. Roseanne Reno: [phone]  In the event of inclement weather, please call our main line at [phone] for an update on the status of any delays or closures.  Dermatology Medication Tips: Please keep the boxes that topical medications come in in order to help keep track of the instructions about where and how to use these. Pharmacies typically print the medication instructions only on the boxes and not directly on the medication tubes.   If your medication is too expensive, please contact our office at [phone] option 4 or send [place] a message  through MyChart.   We are unable to tell what your co-pay for medications will be in advance as this is different depending on your insurance coverage. However, we may be able to find a substitute medication at lower cost or fill out paperwork to get insurance to cover a needed medication.   If a prior authorization is required to get your medication covered by your insurance company, please allow [place] 1-2 business days to complete this process.  Drug prices often vary depending on where the prescription is filled and some pharmacies may offer cheaper prices.  The  website www.goodrx.com contains coupons for medications through different pharmacies. The prices here do not account for what the cost may be with help from insurance (it may be cheaper with your insurance), but the website can give you the price if you did not use any insurance.  - You can print the associated coupon and take it with your prescription to the pharmacy.  - You may also stop by our office during regular business hours and pick up a GoodRx coupon card.  - If you need your prescription sent electronically to a different pharmacy, notify our office through [facility] or by phone at [phone] option 4.

## 2023-04-06 NOTE — Progress Notes (Signed)
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 Patient has used Qbrexa that did help but Medicaid would not pay for it. Drysol does not help. Patient concerned about dehydration because he is sweating so much, having to drink Pedialyte for electrolytes. Patient advises sweating has gotten significantly worse over the last few months, has had headaches, tunnel vision with heat exhaustion. He sweats while indoors and outdoors and soaks his clothing and sheets at night  Also with a bx proven moderate to severe Seth Calderon at left mid abdomen. Patient was going to have shave removal done in [place] but he had other health concerns to deal with so that has not been done. Biopsy was done June 2023. Here to recheck site.   The following portions of the chart were reviewed this encounter and updated as appropriate: medications, allergies, medical history  Review of Systems:  No other skin or systemic complaints except as noted in HPI or Assessment and Plan.  Objective  Well appearing patient in no apparent distress; mood and affect are within normal limits.   A focused examination was performed of the following areas: abdomen  Relevant exam findings are noted in the Assessment and Plan.  left mid abdomen Biopsy site appears clear, faint perifollicular hyperpigmentation       axilla, face Excessive sweating with moisture axilla    Assessment & Plan   Seth Calderon Exam: Tan-brown and/or pink-flesh-colored symmetric macules and papules abdomen  Treatment Plan: Benign appearing on exam today. Recommend observation. Call clinic for new or changing moles. Recommend daily use of broad spectrum spf 30+ sunscreen to sun-exposed areas.    Seth Calderon left mid abdomen  Bx proven moderate to severe Seth Calderon Biopsied June 2023- no recurrence at this time, photo  today  Stable. Observation.  Call clinic for new or changing moles.  Recommend daily use of broad spectrum spf 30+ sunscreen to sun-exposed areas.    Seth Calderon axilla, face  Chronic and persistent condition with duration or expected duration over one year. Condition is bothersome/symptomatic for patient and negatively affecting his quality of life. Currently flared. Pt has tried and failed topical drysol.  Insurance wouldn't pay for Qbrexa (which helped).  Start Qbrexa pads at bedtime to aa.  Will submit for appeal if necessary.  Start glycopyrrolate 1 mg daily for 3 days. If you do not have side effects and are still having excess sweating, increase by 1 mg per day every 3 days up to a maximum of 4 tablets (4 mg) in one day. You will spread out the medication through the day. For example, you will increase from 1 tablet daily to 1 tablet twice daily and then 1 tablet three times daily. If you still do not have good control of sweating and have no side effects, increase to 2 tablets in the morning and one tablet two more times during the day.  DO NOT take more than 4 tablets per day.   Increase dose SLOWLY only as directed.   DO NOT use this medication prior to vigorous exercise. Be cautious using this medication in the heat as it can significantly increase the risk of heat stroke. It can also cause dry mouth, blurred vision, difficulty with urination, headache, constipation, and racing heart.    Glycopyrronium Tosylate (QBREXZA) 2.4 % PADS - axilla, face Apply nightly to affected areas as directed.  glycopyrrolate (ROBINUL) 1 MG tablet - axilla, face Start glycopyrrolate 1 mg daily for 3 days. If you do not have side effects and are still having excess sweating, increase by 1 mg per day every 3 days up to a maximum of 4 tablets (4 mg) in one day.  Calderon    Return for 3 - 4 months , Calderon.  Seth Calderon, RMA, am acting as scribe for Willeen Niece, MD  .   Documentation: I have reviewed the above documentation for accuracy and completeness, and I agree with the above.  Willeen Niece, MD

## 2023-04-11 ENCOUNTER — Other Ambulatory Visit: Payer: Self-pay

## 2023-04-19 ENCOUNTER — Ambulatory Visit: Payer: BC Managed Care – PPO | Admitting: Dermatology

## 2023-08-16 ENCOUNTER — Other Ambulatory Visit: Payer: Self-pay

## 2023-08-16 ENCOUNTER — Ambulatory Visit: Payer: Medicaid Other | Admitting: Dermatology

## 2023-08-16 DIAGNOSIS — T148XXA Other injury of unspecified body region, initial encounter: Secondary | ICD-10-CM

## 2023-08-16 DIAGNOSIS — L281 Prurigo nodularis: Secondary | ICD-10-CM

## 2023-08-16 DIAGNOSIS — D239 Other benign neoplasm of skin, unspecified: Secondary | ICD-10-CM

## 2023-08-16 DIAGNOSIS — L280 Lichen simplex chronicus: Secondary | ICD-10-CM

## 2023-08-16 DIAGNOSIS — L74519 Primary focal hyperhidrosis, unspecified: Secondary | ICD-10-CM

## 2023-08-16 DIAGNOSIS — D229 Melanocytic nevi, unspecified: Secondary | ICD-10-CM

## 2023-08-16 DIAGNOSIS — L74510 Primary focal hyperhidrosis, axilla: Secondary | ICD-10-CM | POA: Diagnosis not present

## 2023-08-16 DIAGNOSIS — D225 Melanocytic nevi of trunk: Secondary | ICD-10-CM

## 2023-08-16 DIAGNOSIS — D235 Other benign neoplasm of skin of trunk: Secondary | ICD-10-CM

## 2023-08-16 MED ORDER — MUPIROCIN 2 % EX OINT: TOPICAL_OINTMENT | CUTANEOUS | 1 refills | Status: DC

## 2023-08-16 MED ORDER — GLYCOPYRROLATE 1 MG PO TABS: ORAL_TABLET | ORAL | 2 refills | Status: AC

## 2023-08-16 MED ORDER — CLOBETASOL PROPIONATE 0.05 % EX CREA: 1.0000 | TOPICAL_CREAM | Freq: Two times a day (BID) | CUTANEOUS | 0 refills | Status: DC

## 2023-08-16 MED ORDER — MUPIROCIN 2 % EX OINT: TOPICAL_OINTMENT | CUTANEOUS | 1 refills | Status: AC

## 2023-08-16 MED ORDER — CLOBETASOL PROPIONATE 0.05 % EX CREA: 1.0000 | TOPICAL_CREAM | Freq: Two times a day (BID) | CUTANEOUS | 0 refills | Status: AC

## 2023-08-16 NOTE — Patient Instructions (Addendum)
Continue glycopyrrolate 1 mg increasing to 2 mg po up to 3 times daily.   Glycopyrrolate can significantly increase the risk of heat stroke so you should avoid using it in the heat, particularly while active. It can also cause dry mouth, blurred vision, difficulty with urination, headache, constipation, and racing heart. Take it only as directed. Never take more than 8 tablets total per day.  Recommend N-acetylcysteine (NAC) 600 mg supplement three times per day to help with picking    Recommend OTC benzoyl peroxide cleanser, wash affected areas daily in shower, let sit several minutes prior to rinsing.  May bleach towels if not rinsed off completely.  Recommended brands include Panoxyl 4% Creamy Wash, CeraVe Acne Foaming Cream wash, or Cetaphil Gentle Clear Complexion-Clearing BPO Acne Cleanser.   Due to recent changes in healthcare laws, you may see results of your pathology and/or laboratory studies on MyChart before the doctors have had a chance to review them. We understand that in some cases there may be results that are confusing or concerning to you. Please understand that not all results are received at the same time and often the doctors may need to interpret multiple results in order to provide you with the best plan of care or course of treatment. Therefore, we ask that you please give [place] 2 business days to thoroughly review all your results before contacting the office for clarification. Should we see a critical lab result, you will be contacted sooner.   If You Need Anything After Your Visit  If you have any questions or concerns for your doctor, please call our main line at [phone] and press option 4 to reach your doctor's medical assistant. If no one answers, please leave a voicemail as directed and we will return your call as soon as possible. Messages left after 4 pm will be answered the following business day.   You may also send [place] a message via MyChart. We typically  respond to MyChart messages within 1-2 business days.  For prescription refills, please ask your pharmacy to contact our office. Our fax number is [phone].  If you have an urgent issue when the clinic is closed that cannot wait until the next business day, you can page your doctor at the number below.    Please note that while we do our best to be available for urgent issues outside of office hours, we are not available 24/7.   If you have an urgent issue and are unable to reach [place], you may choose to seek medical care at your doctor's office, retail clinic, urgent care center, or emergency room.  If you have a medical emergency, please immediately call 911 or go to the emergency department.  Pager Numbers  - Dr. Gwen Pounds: [phone]  - Dr. Roseanne Reno: [phone]  - Dr. Katrinka Blazing: [phone]   In the event of inclement weather, please call our main line at [phone] for an update on the status of any delays or closures.  Dermatology Medication Tips: Please keep the boxes that topical medications come in in order to help keep track of the instructions about where and how to use these. Pharmacies typically print the medication instructions only on the boxes and not directly on the medication tubes.   If your medication is too expensive, please contact our office at [phone] option 4 or send [place] a message through MyChart.   We are unable to tell what your co-pay for medications will be in advance as this is different depending  on your insurance coverage. However, we may be able to find a substitute medication at lower cost or fill out paperwork to get insurance to cover a needed medication.   If a prior authorization is required to get your medication covered by your insurance company, please allow [place] 1-2 business days to complete this process.  Drug prices often vary depending on where the prescription is filled and some pharmacies may offer cheaper prices.  The website  www.goodrx.com contains coupons for medications through different pharmacies. The prices here do not account for what the cost may be with help from insurance (it may be cheaper with your insurance), but the website can give you the price if you did not use any insurance.  - You can print the associated coupon and take it with your prescription to the pharmacy.  - You may also stop by our office during regular business hours and pick up a GoodRx coupon card.  - If you need your prescription sent electronically to a different pharmacy, notify our office through [facility] or by phone at [phone] option 4.

## 2023-08-16 NOTE — Progress Notes (Signed)
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 Currently taking glycopyrrolate 3-4 mg with continued excessive sweating, no side effects. Patient was getting Milana Na but it is no longer covered with insurance.  The patient has spots, moles and lesions to be evaluated, some may be new or changing and the patient may have concern these could be cancer.  Patient has a few spots at back of neck that he has been picking at and wants to know if there is anything he can put on them.  The following portions of the chart were reviewed this encounter and updated as appropriate: medications, allergies, medical history  Review of Systems:  No other skin or systemic complaints except as noted in HPI or Assessment and Plan.  Objective  Well appearing patient in no apparent distress; mood and affect are within normal limits.   A focused examination was performed of the following areas: Face, neck, shoulders, axilla  Relevant exam findings are noted in the Assessment and Plan.  bilateral axilla Excessive sweating     Assessment & Plan   PRURIGO NODULARIS/LICHEN SIMPLEX CHRONICUS Exam: Pink firm excoriated papules at posterior neck, R post shoulder  Chronic and persistent condition with duration or expected duration over one year. Condition is bothersome/symptomatic for patient. Currently flared.   Lichen simplex chronicus [facility]) is a persistent itchy area of thickened skin that is induced by chronic rubbing and/or scratching (chronic dermatitis).  These areas may be pink, hyperpigmented and may have excoriations and bumps (prurigo nodules-PN).  PN/LSC is commonly observed in uncontrolled atopic dermatitis and other forms of eczema, and in other itchy skin conditions (eg, insect bites, scabies).  Sometimes it is not possible to know initial cause of PN/LSC if it has been present for a long time.  It generally  responds well to treatment with high potency topical steroids.  It is important to stop rubbing/scratching the area in order to break the itch-scratch-rash-itch cycle, in order for the rash to resolve.   Treatment Plan: Start clobetasol 0.05% cream twice daily to affected areas at neck as needed. Avoid applying to face, groin, and axilla. Use as directed. Long-term use can cause thinning of the skin. Follow with mupirocin ointment to affected areas at neck daily to open sores and cover. Also discussed cryotherapy in future if not improving  Recommend N-acetylcysteine (NAC) 600 mg supplement three times per day to help with picking  Avoid picking/rubbing/scratching  Topical steroids (such as triamcinolone, fluocinolone, fluocinonide, mometasone, clobetasol, halobetasol, betamethasone, hydrocortisone) can cause thinning and lightening of the skin if they are used for too long in the same area. Your physician has selected the right strength medicine for your problem and area affected on the body. Please use your medication only as directed by your physician to prevent side effects.   Primary focal hyperhidrosis bilateral axilla  Chronic and persistent condition with duration or expected duration over one year. Condition is symptomatic / bothersome to patient. Not to goal.  Pt still sweating at 1 mg PO 3x daily.  Qbrexa works well, but isn't covered on new insurance Continue glycopyrrolate 1 mg increasing to 2 mg po up to 3 times daily.   Glycopyrrolate can significantly increase the risk of heat stroke so you should avoid using it in the heat, particularly while active. It can also cause dry mouth, blurred vision, difficulty with urination, headache, constipation, and racing heart. Take it only as directed.  Never take more than 8 tablets total per day.   Excoriation  Related Medications mupirocin ointment (BACTROBAN) 2 % Apply to open areas on body 1-2 times a day until healed. Cover with  bandage and avoid picking.  Dysplastic nevus left mid abdomen Exam:  round white scar  Bx proven moderate to severe dysplastic nevus, Biopsied June 2023   Clear. Observe for recurrence. Call clinic for new or changing lesions.  Recommend regular skin exams, daily broad-spectrum spf 30+ sunscreen use, and photoprotection.     Nevus 6 mm brown macule adjacent to excision site at left mid abdomen  Benign-appearing.  Observation.  Call clinic for new or changing lesions.  Recommend daily use of broad spectrum spf 30+ sunscreen to sun-exposed areas.    Return in about 4 weeks (around 09/13/2023) for TBSE, with Dr. Roseanne Reno, Hyperhidrosis, Hx Dysplastic Nevi.  Anise Salvo, RMA, am acting as scribe for Willeen Niece, MD .   Documentation: I have reviewed the above documentation for accuracy and completeness, and I agree with the above.  Willeen Niece, MD

## 2023-09-20 ENCOUNTER — Ambulatory Visit: Payer: Medicaid Other | Admitting: Dermatology
